# Patient Record
Sex: Male | Born: 1935
Health system: Southern US, Community
[De-identification: ages and names within clinical notes are randomized; demographics above are authoritative.]

## PROBLEM LIST (undated history)

## (undated) DIAGNOSIS — R001 Bradycardia, unspecified: Secondary | ICD-10-CM

## (undated) DIAGNOSIS — G4730 Sleep apnea, unspecified: Secondary | ICD-10-CM

## (undated) DIAGNOSIS — R55 Syncope and collapse: Secondary | ICD-10-CM

## (undated) DIAGNOSIS — R0600 Dyspnea, unspecified: Secondary | ICD-10-CM

## (undated) DIAGNOSIS — H9190 Unspecified hearing loss, unspecified ear: Secondary | ICD-10-CM

## (undated) DIAGNOSIS — I10 Essential (primary) hypertension: Secondary | ICD-10-CM

## (undated) DIAGNOSIS — E119 Type 2 diabetes mellitus without complications: Secondary | ICD-10-CM

## (undated) DIAGNOSIS — R0609 Other forms of dyspnea: Secondary | ICD-10-CM

## (undated) DIAGNOSIS — E7800 Pure hypercholesterolemia, unspecified: Secondary | ICD-10-CM

## (undated) DIAGNOSIS — R269 Unspecified abnormalities of gait and mobility: Secondary | ICD-10-CM

## (undated) HISTORY — PX: CATARACT EXTRACTION W/ INTRAOCULAR LENS  IMPLANT, BILATERAL: SHX1307

## (undated) HISTORY — DX: Syncope and collapse: R55

## (undated) HISTORY — DX: Bradycardia, unspecified: R00.1

## (undated) HISTORY — DX: Essential (primary) hypertension: I10

## (undated) HISTORY — DX: Unspecified abnormalities of gait and mobility: R26.9

---

## 2001-07-19 ENCOUNTER — Ambulatory Visit (HOSPITAL_COMMUNITY): Admission: RE | Admit: 2001-07-19 | Discharge: 2001-07-19 | Payer: Self-pay | Admitting: Specialist

## 2005-06-23 ENCOUNTER — Emergency Department (HOSPITAL_COMMUNITY): Admission: EM | Admit: 2005-06-23 | Discharge: 2005-06-23 | Payer: Self-pay

## 2007-03-10 ENCOUNTER — Ambulatory Visit: Payer: Self-pay | Admitting: Internal Medicine

## 2007-03-23 ENCOUNTER — Encounter: Payer: Self-pay | Admitting: Internal Medicine

## 2007-03-23 ENCOUNTER — Ambulatory Visit: Payer: Self-pay | Admitting: Internal Medicine

## 2009-10-13 ENCOUNTER — Encounter: Admission: RE | Admit: 2009-10-13 | Discharge: 2009-10-13 | Payer: Self-pay | Admitting: Otolaryngology

## 2011-01-29 NOTE — Op Note (Signed)
[place]  Patient:    Calvin Gray, Calvin Gray Visit Number: [id number] MRN: [id number]          Service Type: DSU Location: [facility] Iv [id number] Attending Physician:  Mick Sell Dictated by:   Chucky May, M.D. Proc. Date: 07/19/01 Admit Date:  07/19/2001 Discharge Date: 07/19/2001                             Operative Report  PREOPERATIVE DIAGNOSIS:  Cataract, right eye.  POSTOPERATIVE DIAGNOSIS:  Cataract, right eye.  OPERATION PERFORMED:  Cataract extraction with intraocular lens implant, right eye.  SURGEON:  Chucky May, M.D.  INDICATIONS FOR SURGERY:  The patient is a 77 year old male with painless progressive decrease in vision so that he has difficulty seeing for reading. On examination he was found to have a dense nuclear sclerotic and cortical cataract consistent with the decrease in visual acuity.  DESCRIPTION OF PROCEDURE:  The patient was brought to the main operating room and placed in supine position.  Anesthesia was obtained by means of topical 4% lidocaine drops with tetracaine.  The patient was then prepped and draped in the usual manner.  A lid speculum was inserted and the cornea was entered with a diamond keratome superiorly with an additional port supratemporally OcuCoat was instilled and an anterior capsulorrhexis was performed without difficulty. The nucleus was mobilized by hydrodissection, phacoemulsified, and residual cortical material removed by irrigation and aspiration.  The posterior capsule was polished and a posterior chamber lens implant was placed in the bag without difficulty.  OcuCoat was removed and replaced with balanced salt solution.  The wound was hydrated with balanced salt solution and checked for fluid leaks but none were noted.  The eye was dressed with topical Pred Forte, Ocuflox, Voltaren and a Fox shield and the patient was taken to the recovery room in excellent condition where he received  written and verbal instructions for his postoperative care and was scheduled for follow up in 24 hours. Dictated by:   Chucky May, M.D. Attending Physician:  Mick Sell DD:  08/02/01 TD:  08/02/01 Job: [id number]

## 2011-09-24 DIAGNOSIS — H43819 Vitreous degeneration, unspecified eye: Secondary | ICD-10-CM | POA: Diagnosis not present

## 2011-09-28 DIAGNOSIS — E559 Vitamin D deficiency, unspecified: Secondary | ICD-10-CM | POA: Diagnosis not present

## 2011-09-28 DIAGNOSIS — E1129 Type 2 diabetes mellitus with other diabetic kidney complication: Secondary | ICD-10-CM | POA: Diagnosis not present

## 2011-10-04 DIAGNOSIS — R972 Elevated prostate specific antigen [PSA]: Secondary | ICD-10-CM | POA: Diagnosis not present

## 2011-10-05 DIAGNOSIS — E1129 Type 2 diabetes mellitus with other diabetic kidney complication: Secondary | ICD-10-CM | POA: Diagnosis not present

## 2011-10-05 DIAGNOSIS — E785 Hyperlipidemia, unspecified: Secondary | ICD-10-CM | POA: Diagnosis not present

## 2011-10-05 DIAGNOSIS — R0602 Shortness of breath: Secondary | ICD-10-CM | POA: Diagnosis not present

## 2011-10-06 DIAGNOSIS — R0602 Shortness of breath: Secondary | ICD-10-CM | POA: Diagnosis not present

## 2011-10-11 DIAGNOSIS — R972 Elevated prostate specific antigen [PSA]: Secondary | ICD-10-CM | POA: Diagnosis not present

## 2011-10-11 DIAGNOSIS — R351 Nocturia: Secondary | ICD-10-CM | POA: Diagnosis not present

## 2011-12-31 DIAGNOSIS — I10 Essential (primary) hypertension: Secondary | ICD-10-CM | POA: Diagnosis not present

## 2011-12-31 DIAGNOSIS — E1129 Type 2 diabetes mellitus with other diabetic kidney complication: Secondary | ICD-10-CM | POA: Diagnosis not present

## 2011-12-31 DIAGNOSIS — E559 Vitamin D deficiency, unspecified: Secondary | ICD-10-CM | POA: Diagnosis not present

## 2011-12-31 DIAGNOSIS — E785 Hyperlipidemia, unspecified: Secondary | ICD-10-CM | POA: Diagnosis not present

## 2012-01-10 DIAGNOSIS — E1129 Type 2 diabetes mellitus with other diabetic kidney complication: Secondary | ICD-10-CM | POA: Diagnosis not present

## 2012-01-10 DIAGNOSIS — E785 Hyperlipidemia, unspecified: Secondary | ICD-10-CM | POA: Diagnosis not present

## 2012-01-10 DIAGNOSIS — I10 Essential (primary) hypertension: Secondary | ICD-10-CM | POA: Diagnosis not present

## 2012-01-31 ENCOUNTER — Encounter (HOSPITAL_COMMUNITY): Payer: Self-pay | Admitting: Pharmacy Technician

## 2012-01-31 DIAGNOSIS — E782 Mixed hyperlipidemia: Secondary | ICD-10-CM | POA: Diagnosis not present

## 2012-01-31 DIAGNOSIS — I10 Essential (primary) hypertension: Secondary | ICD-10-CM | POA: Diagnosis not present

## 2012-01-31 DIAGNOSIS — E119 Type 2 diabetes mellitus without complications: Secondary | ICD-10-CM | POA: Diagnosis not present

## 2012-01-31 DIAGNOSIS — R0609 Other forms of dyspnea: Secondary | ICD-10-CM | POA: Diagnosis not present

## 2012-02-03 DIAGNOSIS — E119 Type 2 diabetes mellitus without complications: Secondary | ICD-10-CM | POA: Diagnosis not present

## 2012-02-03 DIAGNOSIS — I10 Essential (primary) hypertension: Secondary | ICD-10-CM | POA: Diagnosis not present

## 2012-02-03 DIAGNOSIS — R9431 Abnormal electrocardiogram [ECG] [EKG]: Secondary | ICD-10-CM | POA: Diagnosis not present

## 2012-02-03 DIAGNOSIS — R0602 Shortness of breath: Secondary | ICD-10-CM | POA: Diagnosis not present

## 2012-02-03 DIAGNOSIS — Z01810 Encounter for preprocedural cardiovascular examination: Secondary | ICD-10-CM | POA: Diagnosis not present

## 2012-02-07 HISTORY — PX: CORONARY ANGIOPLASTY WITH STENT PLACEMENT: SHX49

## 2012-02-08 ENCOUNTER — Encounter (HOSPITAL_COMMUNITY)
Admission: RE | Disposition: A | Discharge status: Home / Self Care | Payer: Self-pay | Source: Ambulatory Visit | Attending: Cardiology

## 2012-02-08 ENCOUNTER — Encounter (HOSPITAL_COMMUNITY): Payer: Self-pay | Admitting: General Practice

## 2012-02-08 ENCOUNTER — Ambulatory Visit (HOSPITAL_COMMUNITY)
Admission: RE | Admit: 2012-02-08 | Discharge: 2012-02-09 | Disposition: A | Discharge status: Home / Self Care | Payer: Medicare Other | Source: Ambulatory Visit | Attending: Cardiology | Admitting: Cardiology

## 2012-02-08 DIAGNOSIS — E119 Type 2 diabetes mellitus without complications: Secondary | ICD-10-CM | POA: Insufficient documentation

## 2012-02-08 DIAGNOSIS — I10 Essential (primary) hypertension: Secondary | ICD-10-CM | POA: Insufficient documentation

## 2012-02-08 DIAGNOSIS — Z955 Presence of coronary angioplasty implant and graft: Secondary | ICD-10-CM

## 2012-02-08 DIAGNOSIS — Z9861 Coronary angioplasty status: Secondary | ICD-10-CM

## 2012-02-08 DIAGNOSIS — R9431 Abnormal electrocardiogram [ECG] [EKG]: Secondary | ICD-10-CM | POA: Diagnosis not present

## 2012-02-08 DIAGNOSIS — I2510 Atherosclerotic heart disease of native coronary artery without angina pectoris: Secondary | ICD-10-CM | POA: Insufficient documentation

## 2012-02-08 DIAGNOSIS — E785 Hyperlipidemia, unspecified: Secondary | ICD-10-CM | POA: Diagnosis not present

## 2012-02-08 DIAGNOSIS — R0609 Other forms of dyspnea: Secondary | ICD-10-CM | POA: Diagnosis not present

## 2012-02-08 DIAGNOSIS — R0602 Shortness of breath: Secondary | ICD-10-CM | POA: Diagnosis not present

## 2012-02-08 DIAGNOSIS — R001 Bradycardia, unspecified: Secondary | ICD-10-CM

## 2012-02-08 DIAGNOSIS — R0989 Other specified symptoms and signs involving the circulatory and respiratory systems: Secondary | ICD-10-CM | POA: Insufficient documentation

## 2012-02-08 HISTORY — DX: Type 2 diabetes mellitus without complications: E11.9

## 2012-02-08 HISTORY — DX: Other forms of dyspnea: R06.09

## 2012-02-08 HISTORY — DX: Essential (primary) hypertension: I10

## 2012-02-08 HISTORY — DX: Pure hypercholesterolemia, unspecified: E78.00

## 2012-02-08 HISTORY — PX: LEFT HEART CATHETERIZATION WITH CORONARY ANGIOGRAM: SHX5451

## 2012-02-08 HISTORY — DX: Bradycardia, unspecified: R00.1

## 2012-02-08 HISTORY — DX: Unspecified hearing loss, unspecified ear: H91.90

## 2012-02-08 HISTORY — DX: Dyspnea, unspecified: R06.00

## 2012-02-08 LAB — GLUCOSE, CAPILLARY
Glucose-Capillary: 94 mg/dL (ref 70–99)
Glucose-Capillary: 81 mg/dL (ref 70–99)
Glucose-Capillary: 98 mg/dL (ref 70–99)

## 2012-02-08 LAB — BASIC METABOLIC PANEL
BUN: 26 mg/dL — ABNORMAL HIGH (ref 6–23)
CO2: 25 mEq/L (ref 19–32)
Calcium: 9.3 mg/dL (ref 8.4–10.5)
Chloride: 105 mEq/L (ref 96–112)
Creatinine, Ser: 1.48 mg/dL — ABNORMAL HIGH (ref 0.50–1.35)
GFR calc Af Amer: 48 mL/min — ABNORMAL LOW (ref >90)
GFR calc non Af Amer: 42 mL/min — ABNORMAL LOW (ref >90)
Glucose, Bld: 105 mg/dL — ABNORMAL HIGH (ref 70–99)
Potassium: 4.2 mEq/L (ref 3.5–5.1)
Sodium: 140 mEq/L (ref 135–145)

## 2012-02-08 LAB — CBC
HCT: 35.2 % — ABNORMAL LOW (ref 39.0–52.0)
Hemoglobin: 12.6 g/dL — ABNORMAL LOW (ref 13.0–17.0)
MCH: 31.6 pg (ref 26.0–34.0)
MCHC: 35.8 g/dL (ref 30.0–36.0)
MCV: 88.2 fL (ref 78.0–100.0)
Platelets: 154 10*3/uL (ref 150–400)
RBC: 3.99 MIL/uL — ABNORMAL LOW (ref 4.22–5.81)
RDW: 12.9 % (ref 11.5–15.5)
WBC: 6.2 10*3/uL (ref 4.0–10.5)

## 2012-02-08 LAB — PROTIME-INR
INR: 1.18 (ref 0.00–1.49)
Prothrombin Time: 15.2 seconds (ref 11.6–15.2)

## 2012-02-08 LAB — HEMOGLOBIN A1C
Hgb A1c MFr Bld: 5.7 % — ABNORMAL HIGH (ref <5.7)
Mean Plasma Glucose: 117 mg/dL — ABNORMAL HIGH (ref <117)

## 2012-02-08 LAB — APTT: aPTT: 33 seconds (ref 24–37)

## 2012-02-08 SURGERY — LEFT HEART CATHETERIZATION WITH CORONARY ANGIOGRAM
Anesthesia: LOCAL

## 2012-02-08 MED ORDER — ASPIRIN EC 81 MG PO TBEC
81.0000 mg | DELAYED_RELEASE_TABLET | Freq: Every day | ORAL | Status: DC
  Administered 2012-02-09: 81 mg via ORAL
  Filled 2012-02-08 (×2): qty 1

## 2012-02-08 MED ORDER — SODIUM CHLORIDE 0.9 % IJ SOLN: 3.0000 mL | INTRAMUSCULAR | Status: DC | PRN

## 2012-02-08 MED ORDER — BIVALIRUDIN 250 MG IV SOLR
INTRAVENOUS | Status: AC
  Filled 2012-02-08: qty 250

## 2012-02-08 MED ORDER — INSULIN ASPART 100 UNIT/ML ~~LOC~~ SOLN: 0.0000 [IU] | Freq: Three times a day (TID) | SUBCUTANEOUS | Status: DC

## 2012-02-08 MED ORDER — HEPARIN (PORCINE) IN NACL 2-0.9 UNIT/ML-% IJ SOLN
INTRAMUSCULAR | Status: AC
  Filled 2012-02-08: qty 2000

## 2012-02-08 MED ORDER — ASPIRIN 81 MG PO CHEW
324.0000 mg | CHEWABLE_TABLET | ORAL | Status: AC
  Administered 2012-02-08: 324 mg via ORAL
  Filled 2012-02-08: qty 4

## 2012-02-08 MED ORDER — SODIUM CHLORIDE 0.9 % IJ SOLN: 3.0000 mL | Freq: Two times a day (BID) | INTRAMUSCULAR | Status: DC

## 2012-02-08 MED ORDER — NITROGLYCERIN 0.2 MG/ML ON CALL CATH LAB
INTRAVENOUS | Status: AC
  Filled 2012-02-08: qty 1

## 2012-02-08 MED ORDER — SODIUM CHLORIDE 0.9 % IV SOLN: 250.0000 mL | INTRAVENOUS | Status: DC | PRN

## 2012-02-08 MED ORDER — SODIUM CHLORIDE 0.9 % IV SOLN
1.0000 mL/kg/h | INTRAVENOUS | Status: AC
  Administered 2012-02-08: 1 mL/kg/h via INTRAVENOUS

## 2012-02-08 MED ORDER — LIDOCAINE HCL (PF) 1 % IJ SOLN
INTRAMUSCULAR | Status: AC
  Filled 2012-02-08: qty 30

## 2012-02-08 MED ORDER — ACETAMINOPHEN 325 MG PO TABS: 650.0000 mg | ORAL_TABLET | ORAL | Status: DC | PRN

## 2012-02-08 MED ORDER — ASPIRIN 81 MG PO CHEW: 81.0000 mg | CHEWABLE_TABLET | Freq: Every day | ORAL | Status: DC

## 2012-02-08 MED ORDER — SODIUM CHLORIDE 0.9 % IV SOLN
INTRAVENOUS | Status: DC
  Administered 2012-02-08: 08:00:00 via INTRAVENOUS

## 2012-02-08 MED ORDER — MIDAZOLAM HCL 2 MG/2ML IJ SOLN
INTRAMUSCULAR | Status: AC
  Filled 2012-02-08: qty 2

## 2012-02-08 MED ORDER — FENTANYL CITRATE 0.05 MG/ML IJ SOLN
INTRAMUSCULAR | Status: AC
  Filled 2012-02-08: qty 2

## 2012-02-08 MED ORDER — TICAGRELOR 90 MG PO TABS
90.0000 mg | ORAL_TABLET | Freq: Two times a day (BID) | ORAL | Status: DC
  Administered 2012-02-08 – 2012-02-09 (×2): 90 mg via ORAL
  Filled 2012-02-08 (×4): qty 1

## 2012-02-08 MED ORDER — SODIUM CHLORIDE 0.9 % IV SOLN: 250.0000 mL | INTRAVENOUS | Status: DC

## 2012-02-08 MED ORDER — CARVEDILOL 3.125 MG PO TABS
3.1250 mg | ORAL_TABLET | Freq: Two times a day (BID) | ORAL | Status: DC
  Administered 2012-02-08 – 2012-02-09 (×2): 3.125 mg via ORAL
  Filled 2012-02-08 (×4): qty 1

## 2012-02-08 MED ORDER — SIMVASTATIN 10 MG PO TABS
10.0000 mg | ORAL_TABLET | Freq: Every day | ORAL | Status: DC
  Administered 2012-02-08: 10 mg via ORAL
  Filled 2012-02-08 (×2): qty 1

## 2012-02-08 MED ORDER — ONDANSETRON HCL 4 MG/2ML IJ SOLN: 4.0000 mg | Freq: Four times a day (QID) | INTRAMUSCULAR | Status: DC | PRN

## 2012-02-08 MED ORDER — TICAGRELOR 90 MG PO TABS
ORAL_TABLET | ORAL | Status: AC
  Filled 2012-02-08: qty 2

## 2012-02-08 MED ORDER — CHOLECALCIFEROL 10 MCG (400 UNIT) PO TABS
400.0000 [IU] | ORAL_TABLET | Freq: Every day | ORAL | Status: DC
  Administered 2012-02-08 – 2012-02-09 (×2): 400 [IU] via ORAL
  Filled 2012-02-08 (×2): qty 1

## 2012-02-08 NOTE — Progress Notes (Signed)
TR BAND REMOVAL  LOCATION:    right radial  DEFLATED PER PROTOCOL:    yes  TIME BAND OFF / DRESSING APPLIED:    1415   SITE UPON ARRIVAL:    Level 0  SITE AFTER BAND REMOVAL:    Level 0  REVERSE ALLEN'S TEST:     positive  CIRCULATION SENSATION AND MOVEMENT:    Within Normal Limits   yes  COMMENTS:   Tolerated procedure well 

## 2012-02-08 NOTE — Interval H&P Note (Signed)
History and Physical Interval Note:  02/08/2012 8:31 AM  Calvin Gray  has presented today for surgery, with the diagnosis of abnormal ekg/shortness of breath  The various methods of treatment have been discussed with the patient and family. After consideration of risks, benefits and other options for treatment, the patient has consented to  Procedure(s) (LRB): LEFT HEART CATHETERIZATION WITH CORONARY ANGIOGRAM (N/A) as a surgical intervention .  The patients' history has been reviewed, patient examined, no change in status, stable for surgery.  I have reviewed the patients' chart and labs.  Questions were answered to the patient's satisfaction.     Pamella Pert

## 2012-02-08 NOTE — CV Procedure (Signed)
Procedure performed:  Left heart catheterization including hemodynamic monitoring of the left ventricle, selective right and left coronary arteriography. PTCA and stenting of a large mid Circumflex coronary artery with 3.5x22 mm Resolute DES.  Indication patient is a 78 year-old man with history of hypertension,  hyperlipidemia,  Diabetes Mellitus   who presents with dyspnea on exertion. Patient has  had abnormal cardiopulmonary stress test as non invasive testing which was abnormal.  Hence is brought to the cardiac catheterization lab to evaluate her coronary anatomy for definitive diagnosis of CAD.  Hemodynamic data:  Left ventricular pressure was 102/8 with LVEDP of 14 mm mercury. Aortic pressure was 99/50 with a mean of 70 mm mercury.  Left ventricle: Hemodynamics only. To conserve contrast.   Right coronary artery: The vessel is smooth, proximal portion has a 30% stenoses at the crux.   Dominant.  Left main coronary artery is large and normal.  Circumflex coronary artery: A large vessel giving origin to AV nodal and SA nodal branch. Midsegment of this circumflex coronary artery has a ulcerated 80% stenoses.  Ramus intermediate: Large vessel. Gives origin to a secondary branch which has ostial proximal 80-90% stenoses. This secondary branch is at least 2.5 mm in diameter.   LAD:  LAD gives origin to a small  diagonal-1.  LAD has mild luminal irregularities.   Interventional data: Successful PTCA and direct stenting of the mid circumflex coronary artery with 3.5 x 22 mm drug-eluting resolute stent. The stent was deployed at 10 atmospheric pressure and postdilated with a 3.5 x 15 mm [place] Quantum at 16 atmospheric pressure x2 for 40 seconds each. Stenosis reduced from ulcerated 80% to 0% with maintenance of TIMI-3 to TIMI-3 flow at the end of the procedure.  Recommendation: Patient has a large second branch of ramus intermediate which has a ostial proximal stenosis which is amenable for  angioplasty. I will reevaluate his symptoms and if his symptoms persist I been intact for staged PTCA of the same vessel. To conserve contrast and given his age and renal insufficiency I opted not to proceed with intervention of the same. He should be discharged home in the morning if he remains stable.  Technique: Under sterile precautions using a 6 French right radial  arterial access, a 6 French sheath was introduced into the right radial artery. A 5 [place] Tig 4 catheter was advanced into the ascending aorta selective  right coronary artery and left coronary artery was cannulated and angiography was performed in multiple views. The catheter was pulled back Out of the body over exchange length J-wire.  Same catheter was used to perform LV hemodynamics.  Catheter exchanged out of the body over J-Wire. NO immediate complications noted. Patient tolerated the procedure well.     Technique of intervention:  Using a 6 [place] XB 4 guide catheter theLM  coronary  was selected and cannulated. Using Angiomax for anticoagulation, I utilized a Couger guidewire and across the circumflex coronary artery without any difficulty. I placed the tip of the wire into the distal  coronary artery. Angiography was performed.   I proceeded with implantation of a 3.5 x 22 mm resolute  drug-eluting stent into the mid circumflex coronary artery. The stent was deployed at 10 atmospheric pressure for 60  seconds. The stent was then post dilated with a 3.5 x 15 mm [place] Quantum balloon. Post-balloon angioplasty results were excellent with 0% residual stenoses and TIMI-3 flow was maintained. There was no evidence of edge dissection. The guidewire was  withdrawn out of the body and the guide catheter was engaged and pulled out of the body over the J-wire the was no immediate complication. Patient tolerated the procedure well.  Disposition: Patient will be discharged in a.m. unless complications with out-patient follow up. Will need  Dual antiplatelet therapy with Brilinta and ASA 81 mg for at least 12 months.

## 2012-02-08 NOTE — H&P (Signed)
  Please see paper chart  

## 2012-02-09 DIAGNOSIS — R9431 Abnormal electrocardiogram [ECG] [EKG]: Secondary | ICD-10-CM | POA: Diagnosis not present

## 2012-02-09 DIAGNOSIS — I10 Essential (primary) hypertension: Secondary | ICD-10-CM | POA: Diagnosis not present

## 2012-02-09 DIAGNOSIS — E119 Type 2 diabetes mellitus without complications: Secondary | ICD-10-CM | POA: Diagnosis not present

## 2012-02-09 DIAGNOSIS — R0602 Shortness of breath: Secondary | ICD-10-CM | POA: Diagnosis not present

## 2012-02-09 DIAGNOSIS — E785 Hyperlipidemia, unspecified: Secondary | ICD-10-CM | POA: Diagnosis not present

## 2012-02-09 DIAGNOSIS — I2510 Atherosclerotic heart disease of native coronary artery without angina pectoris: Secondary | ICD-10-CM | POA: Diagnosis not present

## 2012-02-09 DIAGNOSIS — Z9861 Coronary angioplasty status: Secondary | ICD-10-CM

## 2012-02-09 DIAGNOSIS — R0609 Other forms of dyspnea: Secondary | ICD-10-CM | POA: Diagnosis not present

## 2012-02-09 LAB — CBC
Hemoglobin: 12.4 g/dL — ABNORMAL LOW (ref 13.0–17.0)
RBC: 3.86 MIL/uL — ABNORMAL LOW (ref 4.22–5.81)

## 2012-02-09 LAB — BASIC METABOLIC PANEL
CO2: 24 mEq/L (ref 19–32)
Glucose, Bld: 97 mg/dL (ref 70–99)
Potassium: 4.1 mEq/L (ref 3.5–5.1)
Sodium: 141 mEq/L (ref 135–145)

## 2012-02-09 LAB — GLUCOSE, CAPILLARY: Glucose-Capillary: 107 mg/dL — ABNORMAL HIGH (ref 70–99)

## 2012-02-09 MED ORDER — PRAVASTATIN SODIUM 40 MG PO TABS: 80.0000 mg | ORAL_TABLET | Freq: Every day | ORAL | Status: DC

## 2012-02-09 MED ORDER — TICAGRELOR 90 MG PO TABS: 90.0000 mg | ORAL_TABLET | Freq: Two times a day (BID) | ORAL | Status: DC

## 2012-02-09 MED FILL — Dextrose Inj 5%: INTRAVENOUS | Qty: 50 | Status: AC

## 2012-02-09 NOTE — Plan of Care (Signed)
Problem: Phase II Progression Outcomes Goal: Pain controlled Outcome: Completed/Met Date Met:  02/09/12 Painfree Goal: OOB to chair per PCI oders Outcome: Completed/Met Date Met:  02/09/12 Tolerating being up in room with no s/s intolerance Goal: Vascular site scale level 0 - I Vascular Site Scale Level 0: No bruising/bleeding/hematoma Level I (Mild): Bruising/Ecchymosis, minimal bleeding/ooozing, palpable hematoma < 3 cm Level II (Moderate): Bleeding not affecting hemodynamic parameters, pseudoaneurysm, palpable hematoma > 3 cm  Outcome: Completed/Met Date Met:  02/09/12 Level 0 Goal: No post PCI angina Outcome: Completed/Met Date Met:  02/09/12 No angina Goal: Distal pulses equal baseline assessment Outcome: Completed/Met Date Met:  02/09/12 Palpable pulses all 4 extremities Goal: Discharge plan established Outcome: Completed/Met Date Met:  02/09/12 Planned to discharge today

## 2012-02-09 NOTE — Discharge Summary (Signed)
Physician Discharge Summary  Patient ID: Calvin Gray MRN: [id number] DOB/AGE: [birth year] 78 y.o.  Admit date: 02/08/2012 Discharge date: 02/09/2012  Primary Discharge Diagnosis 1. Coronary artery disease status post PTCA and stenting of the mid large circumflex coronary artery with implantation of 3.5 x 22 mm resolute drug-eluting stent. 2. Shortness of breath and dyspnea on exertion. Anginal equivalent.  Secondary Discharge Diagnosis 3. Diabetes mellitus diet controlled. Previously was on oral hypoglycemic agents. Stopped due to 2 renal insufficiency and his blood sugars are controlled. 4. Hypertension 5. Hyperlipidemia 6. Chronic renal insufficiency with a creatinine clearance of approximately 45 mL  Significant Diagnostic Studies: Cardiac catheterization revealing high-grade stenoses in the mid circumflex coronary artery. Has a residual secondary branch of a large ramus intermediate with ostial 80-90% stenoses. Right coronary artery showing proximal 30% stenoses. LAD has mild luminal irregularities.  Consults:   Hospital Course: Patient was admitted to the hospital an elective basis for cardiac catheterization for shortness of breath and dyspnea on exertion after he had abnormal cardiopulmonary stress testing. He denied any chest pain. He underwent cardiac catheterization via right radial access route and underwent successful angioplasty. No complications were evident. He remained stable in the hospital and was felt stable for discharge the following day.   Discharge Exam: Blood pressure 134/64, pulse 60, temperature 98.2 F (36.8 C), temperature source Oral, resp. rate 19, height 5\' 10"  (1.778 m), weight 69.4 kg (153 lb), SpO2 96.00%.    General appearance: alert, cooperative, appears stated age and no distress Resp: clear to auscultation bilaterally Cardio: regular rate and rhythm, S1, S2 normal, no murmur, click, rub or gallop GI: soft, non-tender; bowel sounds normal; no masses,   no organomegaly Pulses: 2+ and symmetric Right radial access site without hematoma. Labs:   Lab Results  Component Value Date   WBC 7.4 02/09/2012   HGB 12.4* 02/09/2012   HCT 34.2* 02/09/2012   MCV 88.6 02/09/2012   PLT 157 02/09/2012    Lab 02/09/12 0424  NA 141  K 4.1  CL 109  CO2 24  BUN 26*  CREATININE 1.39*  CALCIUM 8.7  PROT --  BILITOT --  ALKPHOS --  ALT --  AST --  GLUCOSE 97      EKG: 02/09/2012 Sinus bradycardia at a rate of 57 beats a minute. Otherwise normal EKG. Normal axis normal intervals. No evidence of ischemia. No change from EKG done yesterday.  FOLLOW UP PLANS AND APPOINTMENTS  Medication List  As of 02/09/2012  8:36 AM   TAKE these medications         aspirin EC 81 MG tablet   Take 81 mg by mouth daily.      carvedilol 3.125 MG tablet   Commonly known as: COREG   Take 3.125 mg by mouth 2 (two) times daily with a meal.      cholecalciferol 400 UNITS Tabs   Commonly known as: VITAMIN D   Take 400 Units by mouth daily.      lisinopril-hydrochlorothiazide 10-12.5 MG per tablet   Commonly known as: PRINZIDE,ZESTORETIC   Take 1 tablet by mouth daily.      pravastatin 40 MG tablet   Commonly known as: PRAVACHOL   Take 2 tablets (80 mg total) by mouth daily.      Ticagrelor 90 MG Tabs tablet   Commonly known as: BRILINTA   Take 1 tablet (90 mg total) by mouth 2 (two) times daily.           Follow-up  Information    Follow up with Pamella Pert, MD in 2 weeks. (Keep appointment)    Contact information:   [address]  [place] [address] [phone]           Pamella Pert, MD 02/09/2012, 8:34 AM

## 2012-02-09 NOTE — Care Management Note (Signed)
    Page 1 of 1   02/09/2012     11:44:17 AM   CARE MANAGEMENT NOTE 02/09/2012  Patient:  Calvin Gray, Calvin Gray   Account Number:  [id number]  Date Initiated:  02/09/2012  Documentation initiated by:  Alvira Philips Assessment:   78 yr-old male adm for cardiac cath; lives with spouse, independent PTA.     DC Planning Services  CM consult      Status of service:  Completed, signed off  Discharge Disposition:  HOME/SELF CARE  Comments:  02/09/12 1030 Janeene Sand RN MSN CCM Pt to d/c on Brilinta, provided card for free 30-day supply.  Copay will be $37/month, [facility] on [address] has Brilinta in stock.

## 2012-02-09 NOTE — Discharge Instructions (Signed)
Radial Site Care Refer to this sheet in the next few weeks. These instructions provide you with information on caring for yourself after your procedure. Your caregiver may also give you more specific instructions. Your treatment has been planned according to current medical practices, but problems sometimes occur. Call your caregiver if you have any problems or questions after your procedure. HOME CARE INSTRUCTIONS  You may shower the day after the procedure.Remove the bandage (dressing) and gently wash the site with plain soap and water.Gently pat the site dry.   Do not apply powder or lotion to the site.   Do not submerge the affected site in water for 3 to 5 days.   Inspect the site at least twice daily.   Do not flex or bend the affected arm for 24 hours.   No lifting over 5 pounds (2.3 kg) for 5 days after your procedure.   Do not drive home if you are discharged the same day of the procedure. Have someone else drive you.   You may drive 24 hours after the procedure unless otherwise instructed by your caregiver.   Do not operate machinery or power tools for 24 hours.   A responsible adult should be with you for the first 24 hours after you arrive home.  What to expect:  Any bruising will usually fade within 1 to 2 weeks.   Blood that collects in the tissue (hematoma) may be painful to the touch. It should usually decrease in size and tenderness within 1 to 2 weeks.  SEEK IMMEDIATE MEDICAL CARE IF:  You have unusual pain at the radial site.   You have redness, warmth, swelling, or pain at the radial site.   You have drainage (other than a small amount of blood on the dressing).   You have chills.   You have a fever or persistent symptoms for more than 72 hours.   You have a fever and your symptoms suddenly get worse.   Your arm becomes pale, cool, tingly, or numb.   You have heavy bleeding from the site. Hold pressure on the site.  Document Released: 10/02/2010  Document Revised: 08/19/2011 Document Reviewed: 10/02/2010 ExitCare Patient Information 2012 [employer]. 

## 2012-02-09 NOTE — Plan of Care (Signed)
Problem: Phase II Progression Outcomes Goal: Tolerating diet Outcome: Completed/Met Date Met:  02/09/12 Tolerating diet

## 2012-02-09 NOTE — Progress Notes (Signed)
CARDIAC REHAB PHASE I   PRE:  Rate/Rhythm: 66SR  BP:  Supine: 130/66  Sitting:   Standing:    SaO2:   MODE:  Ambulation: 450 ft   POST:  Rate/Rhythem: 77SR  BP:  Supine:   Sitting: 142/66  Standing:    SaO2:  0850-1000 Pt walked 450 ft on RA with steady gait. Denied CP. Stated breathing felt better. Education completed with pt and wife with many questions asked by wife. Stressed importance of taking brillinta. Discussed CRP 2 and pt gave permission to refer to [facility].   Duanne Limerick

## 2012-02-11 MED FILL — Nicardipine HCl IV Soln 2.5 MG/ML: INTRAVENOUS | Qty: 1 | Status: AC

## 2012-02-17 DIAGNOSIS — Z9861 Coronary angioplasty status: Secondary | ICD-10-CM | POA: Diagnosis not present

## 2012-02-17 DIAGNOSIS — I2510 Atherosclerotic heart disease of native coronary artery without angina pectoris: Secondary | ICD-10-CM | POA: Diagnosis not present

## 2012-02-17 DIAGNOSIS — E782 Mixed hyperlipidemia: Secondary | ICD-10-CM | POA: Diagnosis not present

## 2012-02-17 DIAGNOSIS — E119 Type 2 diabetes mellitus without complications: Secondary | ICD-10-CM | POA: Diagnosis not present

## 2012-02-17 DIAGNOSIS — I10 Essential (primary) hypertension: Secondary | ICD-10-CM | POA: Diagnosis not present

## 2012-04-14 DIAGNOSIS — I2510 Atherosclerotic heart disease of native coronary artery without angina pectoris: Secondary | ICD-10-CM | POA: Diagnosis not present

## 2012-04-14 DIAGNOSIS — R0989 Other specified symptoms and signs involving the circulatory and respiratory systems: Secondary | ICD-10-CM | POA: Diagnosis not present

## 2012-04-14 DIAGNOSIS — R0609 Other forms of dyspnea: Secondary | ICD-10-CM | POA: Diagnosis not present

## 2012-04-14 DIAGNOSIS — I10 Essential (primary) hypertension: Secondary | ICD-10-CM | POA: Diagnosis not present

## 2012-04-14 DIAGNOSIS — E782 Mixed hyperlipidemia: Secondary | ICD-10-CM | POA: Diagnosis not present

## 2012-04-14 DIAGNOSIS — Z9861 Coronary angioplasty status: Secondary | ICD-10-CM | POA: Diagnosis not present

## 2012-04-17 DIAGNOSIS — R0602 Shortness of breath: Secondary | ICD-10-CM | POA: Diagnosis not present

## 2012-04-17 DIAGNOSIS — I2510 Atherosclerotic heart disease of native coronary artery without angina pectoris: Secondary | ICD-10-CM | POA: Diagnosis not present

## 2012-04-17 DIAGNOSIS — E7800 Pure hypercholesterolemia, unspecified: Secondary | ICD-10-CM | POA: Diagnosis not present

## 2012-05-05 DIAGNOSIS — R0602 Shortness of breath: Secondary | ICD-10-CM | POA: Diagnosis not present

## 2012-05-05 DIAGNOSIS — I2510 Atherosclerotic heart disease of native coronary artery without angina pectoris: Secondary | ICD-10-CM | POA: Diagnosis not present

## 2012-05-05 DIAGNOSIS — I10 Essential (primary) hypertension: Secondary | ICD-10-CM | POA: Diagnosis not present

## 2012-05-12 DIAGNOSIS — R0609 Other forms of dyspnea: Secondary | ICD-10-CM | POA: Diagnosis not present

## 2012-05-12 DIAGNOSIS — Z9861 Coronary angioplasty status: Secondary | ICD-10-CM | POA: Diagnosis not present

## 2012-05-12 DIAGNOSIS — I2510 Atherosclerotic heart disease of native coronary artery without angina pectoris: Secondary | ICD-10-CM | POA: Diagnosis not present

## 2012-05-12 DIAGNOSIS — E119 Type 2 diabetes mellitus without complications: Secondary | ICD-10-CM | POA: Diagnosis not present

## 2012-05-12 DIAGNOSIS — R0989 Other specified symptoms and signs involving the circulatory and respiratory systems: Secondary | ICD-10-CM | POA: Diagnosis not present

## 2012-06-09 ENCOUNTER — Ambulatory Visit (INDEPENDENT_AMBULATORY_CARE_PROVIDER_SITE_OTHER): Payer: Medicare Other | Admitting: Pulmonary Disease

## 2012-06-09 ENCOUNTER — Encounter: Payer: Self-pay | Admitting: Pulmonary Disease

## 2012-06-09 VITALS — BP 124/80 | HR 65 | Temp 98.1°F | Ht 70.0 in | Wt 167.0 lb

## 2012-06-09 DIAGNOSIS — G4710 Hypersomnia, unspecified: Secondary | ICD-10-CM | POA: Diagnosis not present

## 2012-06-09 DIAGNOSIS — G4731 Primary central sleep apnea: Secondary | ICD-10-CM | POA: Insufficient documentation

## 2012-06-09 NOTE — Patient Instructions (Addendum)
Will schedule for a sleep study to evaluate your abnormal breathing at night, and fatigue/sleepiness during the day.  Will arrange followup with me once the results are available.

## 2012-06-09 NOTE — Progress Notes (Signed)
  Subjective:    Patient ID: Calvin Gray, male    DOB: 04/25/1926, 78 y.o.   MRN: [id number]  HPI The patient is a very pleasant 78 year old male who I have been asked to see for possible sleep apnea.  He has been noted by his wife to have loud breathing during sleep, along with what she describes as pauses and possibly gasping arousals.  She does not believe that he has definite true snoring.  The patient states that he awakens at least 2 times a night, and very often he has difficulties getting back to sleep.  He will typically go to his family room where he will eat breakfast and then will fall asleep in the recliner.  He does not feel rested in the mornings upon arising, and notes significant fatigue during the day.  He also has some true sleepiness at times when watching television or reading.  He also notes an occasional sleepiness issue with driving distances.  The patient states that his weight is down 5 pounds over the last 2 years, and his Epworth sleepiness score today is abnormal at 11.  Sleep Questionnaire: What time do you typically go to bed?( Between what hours) 1030-11pm How long does it take you to fall asleep? 5-23mins unless reading How many times during the night do you wake up? 3 What time do you get out of bed to start your day? 0500 Do you drive or operate heavy machinery in your occupation? No How much has your weight changed (up or down) over the past two years? (In pounds) 5 lb (2.268 kg) Have you ever had a sleep study before? No Do you currently use CPAP? No Do you wear oxygen at any time? No    Review of Systems  Constitutional: Negative for fever and unexpected weight change.  HENT: Negative for ear pain, nosebleeds, congestion, sore throat, rhinorrhea, sneezing, trouble swallowing, dental problem, postnasal drip and sinus pressure.   Eyes: Negative for redness and itching.  Respiratory: Positive for shortness of breath ( with heavy exertion). Negative for cough, chest  tightness and wheezing.   Cardiovascular: Negative for palpitations and leg swelling.  Gastrointestinal: Negative for nausea and vomiting.  Genitourinary: Positive for frequency ( wakes up atleast 2-3 times to use restroom at night). Negative for dysuria.  Musculoskeletal: Negative for joint swelling.  Skin: Negative for rash.  Neurological: Negative for headaches.  Hematological: Does not bruise/bleed easily.  Psychiatric/Behavioral: Negative for dysphoric mood. The patient is not nervous/anxious.        Objective:   Physical Exam Constitutional:  Well developed, no acute distress  HENT:  Nares patent without discharge, mild septal deviation to the right  Oropharynx without exudate, palate and uvula are minimally elongated.  Eyes:  Perrla, eomi, no scleral icterus  Neck:  No JVD, no TMG  Cardiovascular:  Normal rate, regular rhythm, no rubs or gallops.  No murmurs        Intact distal pulses  Pulmonary :  Normal breath sounds, no stridor or respiratory distress   No rales, rhonchi, or wheezing  Abdominal:  Soft, nondistended, bowel sounds present.  No tenderness noted.   Musculoskeletal:  No significant lower extremity edema noted.  Lymph Nodes:  No cervical lymphadenopathy noted  Skin:  No cyanosis noted  Neurologic:  Appears sleepy, appropriate, moves all 4 extremities without obvious deficit.         Assessment & Plan:

## 2012-06-09 NOTE — Assessment & Plan Note (Signed)
The patient is having both fatigue and sleepiness during the day that may be related to sleep-disordered breathing.  His wife describes either obstructive or central apnea, and he clearly has sleep disruption at night that leads to nonrestorative sleep.  He also has an issue with returning to sleep once he awakens.  The patient is not obese, nor does he have a significantly abnormal upper airway.  However, this may be central rather than obstructive sleep apnea.  At this point, I think he would benefit from further evaluation with a sleep study.  The patient is agreeable to this approach.

## 2012-06-26 ENCOUNTER — Ambulatory Visit (HOSPITAL_BASED_OUTPATIENT_CLINIC_OR_DEPARTMENT_OTHER): Payer: Medicare Other | Attending: Pulmonary Disease

## 2012-06-26 VITALS — Ht 70.0 in | Wt 167.0 lb

## 2012-06-26 DIAGNOSIS — G4733 Obstructive sleep apnea (adult) (pediatric): Secondary | ICD-10-CM | POA: Diagnosis not present

## 2012-06-26 DIAGNOSIS — G4737 Central sleep apnea in conditions classified elsewhere: Secondary | ICD-10-CM | POA: Insufficient documentation

## 2012-06-26 DIAGNOSIS — G4710 Hypersomnia, unspecified: Secondary | ICD-10-CM

## 2012-06-27 DIAGNOSIS — E7800 Pure hypercholesterolemia, unspecified: Secondary | ICD-10-CM | POA: Diagnosis not present

## 2012-06-27 DIAGNOSIS — I2510 Atherosclerotic heart disease of native coronary artery without angina pectoris: Secondary | ICD-10-CM | POA: Diagnosis not present

## 2012-07-02 DIAGNOSIS — G4710 Hypersomnia, unspecified: Secondary | ICD-10-CM | POA: Diagnosis not present

## 2012-07-02 DIAGNOSIS — G4730 Sleep apnea, unspecified: Secondary | ICD-10-CM

## 2012-07-03 NOTE — Procedures (Signed)
NAME:  Calvin Gray, Calvin Gray                ACCOUNT NO.:  [id number]  MEDICAL RECORD NO.:  [id number]          PATIENT TYPE:  OUT  LOCATION:  SLEEP CENTER                 FACILITY[facility]  [facility]  PHYSICIAN:  Barbaraann Share, MD,FCCPDATE OF BIRTH:  05[birth year]16/1928  DATE OF STUDY:  06/26/2012                           NOCTURNAL POLYSOMNOGRAM  REFERRING PHYSICIAN:  Barbaraann Share, MD,FCCP  REFERRING PHYSICIAN:  Barbaraann Share, MD,FCCP  INDICATION FOR STUDY:  Hypersomnia with sleep apnea.  EPWORTH SLEEPINESS SCORE:  9.  SLEEP ARCHITECTURE:  The patient had a total sleep time of only 93 minutes with no slow-wave sleep or REM noted.  Sleep onset latency was prolonged at 106 minutes and sleep efficiency was very poor at 25%.  RESPIRATORY DATA:  The patient was noted to have 61 apneas with 59 being central in nature, as well as 17 obstructive hypopneas.  This gave him an apnea/hypopnea index of 50 events per hour.  The events occurred in all body positions, and there was mild snoring noted throughout.  OXYGEN DATA:  There was O2 desaturation as low as 89% with the patient's obstructive and central events.  CARDIAC DATA:  Occasional PVC noted, but no clinically significant arrhythmias were seen.  MOVEMENT/PARASOMNIA:  The patient had no significant leg jerks or other abnormal behaviors.  IMPRESSION/RECOMMENDATION: 1. Severe central and obstructive sleep apnea with an AHI of 50 events     per hour and oxygen desaturation as low as 89%.  Treatment for this     degree of sleep apnea should focus on some type of positive airway     pressure device, as well as modest weight loss.  Clinical     correlation is suggested 2. Occasional premature ventricular contraction noted, but no     clinically significant arrhythmias were seen.     Barbaraann Share, MD,FCCP Diplomate, American Board of Sleep Medicine    [facility]/MED[facility]  D:  07/02/2012 16:14:37  T:  07/03/2012 05:45:14  Job:  [id number]

## 2012-07-04 DIAGNOSIS — Z23 Encounter for immunization: Secondary | ICD-10-CM | POA: Diagnosis not present

## 2012-07-04 DIAGNOSIS — I10 Essential (primary) hypertension: Secondary | ICD-10-CM | POA: Diagnosis not present

## 2012-07-04 DIAGNOSIS — E1129 Type 2 diabetes mellitus with other diabetic kidney complication: Secondary | ICD-10-CM | POA: Diagnosis not present

## 2012-07-04 DIAGNOSIS — E559 Vitamin D deficiency, unspecified: Secondary | ICD-10-CM | POA: Diagnosis not present

## 2012-07-04 DIAGNOSIS — Z0000 Encounter for general adult medical examination without abnormal findings: Secondary | ICD-10-CM | POA: Diagnosis not present

## 2012-07-04 DIAGNOSIS — E785 Hyperlipidemia, unspecified: Secondary | ICD-10-CM | POA: Diagnosis not present

## 2012-07-07 ENCOUNTER — Ambulatory Visit (INDEPENDENT_AMBULATORY_CARE_PROVIDER_SITE_OTHER): Payer: Medicare Other | Admitting: Pulmonary Disease

## 2012-07-07 ENCOUNTER — Encounter: Payer: Self-pay | Admitting: Pulmonary Disease

## 2012-07-07 VITALS — BP 132/82 | HR 67 | Temp 97.8°F | Ht 70.0 in | Wt 168.8 lb

## 2012-07-07 DIAGNOSIS — G4731 Primary central sleep apnea: Secondary | ICD-10-CM

## 2012-07-07 DIAGNOSIS — G4730 Sleep apnea, unspecified: Secondary | ICD-10-CM

## 2012-07-07 DIAGNOSIS — G4710 Hypersomnia, unspecified: Secondary | ICD-10-CM

## 2012-07-07 NOTE — Progress Notes (Signed)
  Subjective:    Patient ID: Calvin Gray, male    DOB: 14-Mar-1926, 78 y.o.   MRN: [id number]  HPI The patient comes in today for followup of his recent sleep study.  He was found to have severe central and obstructive sleep apnea with an AHI of 50 events per hour.  Most of his events were central in nature.  I have reviewed the study with him in detail, and answered all of his questions.   Review of Systems  Constitutional: Negative for fever and unexpected weight change.  HENT: Negative for ear pain, nosebleeds, congestion, sore throat, rhinorrhea, sneezing, trouble swallowing, dental problem, postnasal drip and sinus pressure.   Eyes: Negative for redness and itching.  Respiratory: Negative for cough, chest tightness, shortness of breath and wheezing.   Cardiovascular: Negative for palpitations and leg swelling.  Gastrointestinal: Negative for nausea and vomiting.  Genitourinary: Negative for dysuria.  Musculoskeletal: Negative for joint swelling.  Skin: Negative for rash.  Neurological: Negative for headaches.  Hematological: Bruises/bleeds easily.  Psychiatric/Behavioral: Negative for dysphoric mood. The patient is not nervous/anxious.        Objective:   Physical Exam Well-developed male in no acute distress Nose without purulence or discharge noted Neck without lymphadenopathy or thyromegaly Lower extremities without edema, no cyanosis Alert and oriented, moves all 4 extremities.       Assessment & Plan:

## 2012-07-07 NOTE — Patient Instructions (Addendum)
Will start on cpap for the treatment of your central and obstructive sleep apnea.  Please call if having issues with tolerance. followup with me in 6 weeks.

## 2012-07-07 NOTE — Assessment & Plan Note (Addendum)
The patient has a complex sleep apnea with both central and obstructive events.  Because it is severe in nature, and the patient is symptomatic, I will start him initially on CPAP to see how he responds.  Because the majority of his events are central in nature, he may need adaptive servo ventilation.

## 2012-07-11 DIAGNOSIS — E785 Hyperlipidemia, unspecified: Secondary | ICD-10-CM | POA: Diagnosis not present

## 2012-07-11 DIAGNOSIS — E1129 Type 2 diabetes mellitus with other diabetic kidney complication: Secondary | ICD-10-CM | POA: Diagnosis not present

## 2012-07-11 DIAGNOSIS — I10 Essential (primary) hypertension: Secondary | ICD-10-CM | POA: Diagnosis not present

## 2012-07-11 DIAGNOSIS — I2510 Atherosclerotic heart disease of native coronary artery without angina pectoris: Secondary | ICD-10-CM | POA: Diagnosis not present

## 2012-07-14 ENCOUNTER — Telehealth: Payer: Self-pay | Admitting: Pulmonary Disease

## 2012-07-14 NOTE — Telephone Encounter (Signed)
I spoke with [facility] and they have everything they need and they will contact the pt the first of the week.  Pt spouse advised.Carron Curie, CMA

## 2012-08-18 ENCOUNTER — Ambulatory Visit: Payer: Medicare Other | Admitting: Pulmonary Disease

## 2012-08-30 ENCOUNTER — Encounter: Payer: Self-pay | Admitting: Pulmonary Disease

## 2012-08-30 ENCOUNTER — Ambulatory Visit (INDEPENDENT_AMBULATORY_CARE_PROVIDER_SITE_OTHER): Payer: Medicare Other | Admitting: Pulmonary Disease

## 2012-08-30 VITALS — BP 118/78 | HR 62 | Temp 97.6°F | Ht 70.0 in | Wt 166.0 lb

## 2012-08-30 DIAGNOSIS — G4731 Primary central sleep apnea: Secondary | ICD-10-CM

## 2012-08-30 DIAGNOSIS — G4730 Sleep apnea, unspecified: Secondary | ICD-10-CM

## 2012-08-30 NOTE — Patient Instructions (Addendum)
Your machine is currently set on auto mode.  I would like to get a download now for the last few weeks, and see how the pressure is doing and whether there is too much mask leak.  I will call you once I receive. Work with [employer] company on a better mask fit, or I can refer you to the sleep center during the day for a formal mask fitting.  Let me know. Will arrange followup once I see your download.

## 2012-08-30 NOTE — Progress Notes (Signed)
  Subjective:    Patient ID: Colon Branch, male    DOB: 03/15/1926, 78 y.o.   MRN: [id number]  HPI The patient comes in today for followup of his central sleep apnea.  He was started on CPAP at the last visit, and is currently on the automatic setting.  He has been wearing the device compliantly, but has not seen a change in his sleep or daytime alertness.  He is having some mask leak issues, and these have not resolved as of yet.   Review of Systems  Constitutional: Negative for fever and unexpected weight change.  HENT: Positive for nosebleeds ( when first starting CPAP) and sneezing. Negative for ear pain, congestion, sore throat, rhinorrhea, trouble swallowing, dental problem, postnasal drip and sinus pressure.   Eyes: Negative for redness and itching.  Respiratory: Negative for cough, chest tightness, shortness of breath and wheezing.   Cardiovascular: Negative for palpitations and leg swelling.  Gastrointestinal: Negative for nausea and vomiting.  Genitourinary: Negative for dysuria.  Musculoskeletal: Negative for joint swelling.  Skin: Negative for rash.  Neurological: Negative for headaches.  Hematological: Does not bruise/bleed easily.  Psychiatric/Behavioral: Negative for dysphoric mood. The patient is not nervous/anxious.        Objective:   Physical Exam Well-developed male in no acute distress Nose without purulence or discharge noted No skin breakdown or pressure necrosis from CPAP mask Neck without lymphadenopathy or thyromegaly Lower extremities without edema, cyanosis Alert and oriented, moves all 4 extremities.  Does not appear to be overly sleepy.       Assessment & Plan:

## 2012-08-30 NOTE — Assessment & Plan Note (Signed)
The patient has known complex sleep apnea, and was started on CPAP with the automatic setting at the last visit.  He does not feel that he is sleeping any better, and does not have improved daytime alertness.  It is unclear if this is because of mask leaks with insufficient pressure, or whether he may be having pressure induced central apneas that are worsened.  I would like to get a download off of his machine, and see where things are.  This will also allow [place] to evaluate his mask leak.  I would like to have a formal mask fitting either at the sleep Center or with his medical equipment company.

## 2012-09-26 ENCOUNTER — Telehealth: Payer: Self-pay | Admitting: Pulmonary Disease

## 2012-09-26 DIAGNOSIS — G4733 Obstructive sleep apnea (adult) (pediatric): Secondary | ICD-10-CM

## 2012-09-26 NOTE — Telephone Encounter (Signed)
Will forward to Ashtyn to call for results

## 2012-09-26 NOTE — Telephone Encounter (Signed)
Not that I know of .

## 2012-09-26 NOTE — Telephone Encounter (Signed)
D/L given to Dr Shelle Iron to review.

## 2012-09-26 NOTE — Telephone Encounter (Signed)
Please advise If you received download KC thanks

## 2012-09-27 NOTE — Telephone Encounter (Signed)
Please let pt know that his download does show significant mask leak, and he is having some mild breakthru apnea.  This may be the reason for his lack of clinical response. Would like him to get a new mask that fits ( either thru dme or at sleep center during day), and then repeat his auto study for another 2 weeks.  If he continues to have issues, may need to look at a different type of machine.  Let us know when he has a mask that fits wells and doesn't leak, and we will arrange the repeat 2 week auto

## 2012-09-28 NOTE — Telephone Encounter (Signed)
Pt and spouse aware of results and wants to have another mask fitting with [facility]. Order will be placed for this. Pt will call to let [facility] know when he has his new mask and we can then order another download for him.

## 2012-09-29 DIAGNOSIS — H43819 Vitreous degeneration, unspecified eye: Secondary | ICD-10-CM | POA: Diagnosis not present

## 2012-10-06 DIAGNOSIS — R972 Elevated prostate specific antigen [PSA]: Secondary | ICD-10-CM | POA: Diagnosis not present

## 2012-10-13 DIAGNOSIS — R972 Elevated prostate specific antigen [PSA]: Secondary | ICD-10-CM | POA: Diagnosis not present

## 2012-10-13 DIAGNOSIS — C649 Malignant neoplasm of unspecified kidney, except renal pelvis: Secondary | ICD-10-CM | POA: Diagnosis not present

## 2012-10-13 DIAGNOSIS — N320 Bladder-neck obstruction: Secondary | ICD-10-CM | POA: Diagnosis not present

## 2012-10-13 DIAGNOSIS — R351 Nocturia: Secondary | ICD-10-CM | POA: Diagnosis not present

## 2012-10-13 DIAGNOSIS — R809 Proteinuria, unspecified: Secondary | ICD-10-CM | POA: Diagnosis not present

## 2012-10-30 ENCOUNTER — Telehealth: Payer: Self-pay | Admitting: Pulmonary Disease

## 2012-10-30 DIAGNOSIS — G4733 Obstructive sleep apnea (adult) (pediatric): Secondary | ICD-10-CM

## 2012-10-30 NOTE — Telephone Encounter (Signed)
Please read my last note on this pt.  We are supposed to get a download from dme, then will call him with followup time.  See if we can get his download.

## 2012-10-30 NOTE — Telephone Encounter (Signed)
Asthyn, can you please track down the download

## 2012-10-30 NOTE — Telephone Encounter (Signed)
The pt finally has his new mask and wants to know when he needs to come in for another appt. He will send his modem back to [facility] but is aware he will need to bring his chip in so we can download his information off the machine.

## 2012-10-31 NOTE — Telephone Encounter (Signed)
Unable to reach anyone at [facility]....will send over fax requesting this information. Request faxed to [phone]

## 2012-10-31 NOTE — Telephone Encounter (Signed)
D/L has been faxed over and in in your Jerrico Covello folder for your review.

## 2012-11-01 ENCOUNTER — Encounter: Payer: Self-pay | Admitting: Pulmonary Disease

## 2012-11-01 NOTE — Telephone Encounter (Signed)
Spoke with pt and his spouse and notified of recs per [facility] He agrees to mask fit and auto setting I have sent order to [facility] for mask fit Nothing further needed per pt

## 2012-11-01 NOTE — Telephone Encounter (Signed)
LMTCB

## 2012-11-01 NOTE — Telephone Encounter (Signed)
Let him know I have reviewed his download, and it appears that his sleep apnea is adequately controlled on the auto setting.  He does have a mask leak that I would like to see lower, and perhaps this is contributing to his persistent symptoms?  I would like to have him work on mask fit, and would suggest referral to sleep center during the day to help with this if he is in agreement.  The other thing to consider is putting his machine on a fixed pressure of 13-14 and see if he will sleep better?

## 2012-11-06 ENCOUNTER — Ambulatory Visit (HOSPITAL_BASED_OUTPATIENT_CLINIC_OR_DEPARTMENT_OTHER): Payer: Medicare Other | Attending: Pulmonary Disease | Admitting: Radiology

## 2012-11-10 DIAGNOSIS — E782 Mixed hyperlipidemia: Secondary | ICD-10-CM | POA: Diagnosis not present

## 2012-12-06 ENCOUNTER — Encounter: Payer: Self-pay | Admitting: Pulmonary Disease

## 2013-01-09 DIAGNOSIS — E559 Vitamin D deficiency, unspecified: Secondary | ICD-10-CM | POA: Diagnosis not present

## 2013-01-09 DIAGNOSIS — E785 Hyperlipidemia, unspecified: Secondary | ICD-10-CM | POA: Diagnosis not present

## 2013-01-09 DIAGNOSIS — E1129 Type 2 diabetes mellitus with other diabetic kidney complication: Secondary | ICD-10-CM | POA: Diagnosis not present

## 2013-01-09 DIAGNOSIS — I10 Essential (primary) hypertension: Secondary | ICD-10-CM | POA: Diagnosis not present

## 2013-01-16 DIAGNOSIS — E785 Hyperlipidemia, unspecified: Secondary | ICD-10-CM | POA: Diagnosis not present

## 2013-01-16 DIAGNOSIS — I10 Essential (primary) hypertension: Secondary | ICD-10-CM | POA: Diagnosis not present

## 2013-01-16 DIAGNOSIS — E1129 Type 2 diabetes mellitus with other diabetic kidney complication: Secondary | ICD-10-CM | POA: Diagnosis not present

## 2013-01-16 DIAGNOSIS — I2510 Atherosclerotic heart disease of native coronary artery without angina pectoris: Secondary | ICD-10-CM | POA: Diagnosis not present

## 2013-04-25 ENCOUNTER — Telehealth: Payer: Self-pay | Admitting: Pulmonary Disease

## 2013-04-25 NOTE — Telephone Encounter (Signed)
He needs ov and is to bring his machine with him with card and power cord for download.  Make sure he has a resmed machine.

## 2013-04-25 NOTE — Telephone Encounter (Signed)
I spoke with spouse. She stated pt is still c/o feeling tired. He had a mask fit back in feb 2014 over at the sleep center-has full face mask. His machine is still on auto setting. Not sure if the mask is leaking or not. They have not been told when to f/u with [facility]. His last OV was back in December. Please advise KC thanks

## 2013-04-27 NOTE — Telephone Encounter (Signed)
Pt's spouse returned triage's call & can be reached at [phone].  Antionette Fairy

## 2013-04-27 NOTE — Telephone Encounter (Signed)
ATC patient/patient spouse No answer [email or web address]

## 2013-04-27 NOTE — Telephone Encounter (Signed)
Spoke with patients wife-- Patient has been scheduled for Tues 05/22/13 @ 11am She is aware to bring CPAP,card and cord and reports patient does have a ResMed machine Nothing further needed at this time

## 2013-05-22 ENCOUNTER — Encounter: Payer: Self-pay | Admitting: Pulmonary Disease

## 2013-05-22 ENCOUNTER — Ambulatory Visit (INDEPENDENT_AMBULATORY_CARE_PROVIDER_SITE_OTHER): Payer: Medicare Other | Admitting: Pulmonary Disease

## 2013-05-22 VITALS — BP 120/64 | HR 63 | Temp 97.8°F | Ht 70.0 in | Wt 171.6 lb

## 2013-05-22 DIAGNOSIS — G4730 Sleep apnea, unspecified: Secondary | ICD-10-CM

## 2013-05-22 DIAGNOSIS — G4731 Primary central sleep apnea: Secondary | ICD-10-CM

## 2013-05-22 NOTE — Progress Notes (Signed)
  Subjective:    Patient ID: Calvin Gray, male    DOB: 19-Jun-1926, 79 y.o.   MRN: [id number]  HPI The patient comes in today for followup of his complex sleep apnea.  He has been wearing CPAP compliantly, but has multiple issues that are interfering with his therapy.  He continues to have a lot of mask leaks, and is trying different masks and is unsure of the adjustments.  He states he currently has excessive leak on the mask he is using.  On his download today it appears that his machine has reverted from the automatic setting to a fixed pressure of 5 cm.  It is unclear how that has happened.  Finally, because he is concerned about adequate usage, he is putting the CPAP device on at 7:00 while he is watching TV in the family room.  He didn't that naps through the night, and awakens between 3 and 4 AM with the inability to return to sleep.   Review of Systems  Constitutional: Positive for fatigue. Negative for fever and unexpected weight change.  HENT: Negative for ear pain, nosebleeds, congestion, sore throat, rhinorrhea, sneezing, trouble swallowing, dental problem, postnasal drip and sinus pressure.   Eyes: Negative for redness and itching.  Respiratory: Negative for cough, chest tightness, shortness of breath and wheezing.   Cardiovascular: Negative for palpitations and leg swelling.  Gastrointestinal: Negative for nausea and vomiting.  Genitourinary: Negative for dysuria.  Musculoskeletal: Negative for joint swelling.  Skin: Negative for rash.  Neurological: Negative for headaches.  Hematological: Does not bruise/bleed easily.  Psychiatric/Behavioral: Negative for dysphoric mood. The patient is not nervous/anxious.        Objective:   Physical Exam Thin male in no acute distress Nose without purulence or discharge noted No skin breakdown or pressure necrosis from CPAP mask Neck without lymphadenopathy or thyromegaly Lower extremities without edema, cyanosis Alert and oriented, moves  all 4 extremities.       Assessment & Plan:

## 2013-05-22 NOTE — Assessment & Plan Note (Signed)
The patient continues to have issues with his mask, and also with his pressure.  I really think that we can help him if we can deliver adequate pressure to his upper airway without poor tolerance or mask leaking.  I will get him back to his medical equipment company to get his machine reset, make sure that it isn't working order, and to work with him on his mask fit.  Finally, I told the patient to not put his CPAP device on until he goes to bed and actually wants to go to sleep.

## 2013-05-22 NOTE — Patient Instructions (Addendum)
We are going to get your home care company to address your mask and pressure issues.  I would like you to call me in 2 weeks to give me an update how all of this is going.  followup with me in 6mos to check on your progress.

## 2013-06-11 ENCOUNTER — Telehealth: Payer: Self-pay | Admitting: Pulmonary Disease

## 2013-06-11 NOTE — Telephone Encounter (Signed)
Pt was last seen by [facility] on 05/22/13 with the following pt instructions:  Patient Instructions    We are going to get your home care company to address your mask and pressure issues. I would like you to call me in 2 weeks to give me an update how all of this is going.  followup with me in 6mos to check on your progress.    ------  [email or web address]

## 2013-06-12 ENCOUNTER — Other Ambulatory Visit: Payer: Self-pay | Admitting: Pulmonary Disease

## 2013-06-12 DIAGNOSIS — G4731 Primary central sleep apnea: Secondary | ICD-10-CM

## 2013-06-12 NOTE — Telephone Encounter (Signed)
[email or web address].Jennifer Castillo, CMA  

## 2013-06-12 NOTE — Telephone Encounter (Signed)
Pt's wife returned call and can be reached @ [phone]. Calvin Gray

## 2013-06-12 NOTE — Telephone Encounter (Signed)
Spoke with pt and wife-- Pt states that CPAP does not seem to be helping iwht his fatigue, decreased energy and excessive tiredness during the daytime.  Pt states that he wears it nightly starting at 10pm. States he just does not feel that it is working at all. Patient states that he is wanting to stop using.  D/L from machine from dates 9/14-9/27 in your green folder for review.

## 2013-06-12 NOTE — Telephone Encounter (Signed)
Let pt know  That his download shows his sleep apnea is fairly well controlled with the cpap.  It is doing what it is supposed to do.  I will send an order to dme to increase the upside pressure a bit on the auto setting, but I do not think his sleep apnea is the cause of his symptoms.  There are many other things that cause fatigue and lack of energy.  Do not stop using the cpap, and discuss your symptoms with your primary md.

## 2013-06-13 NOTE — Telephone Encounter (Signed)
I spoke with spouse and made her aware of [place] response. She wrote down what was told to her and will make pt aware. Nothing further needed

## 2013-06-25 DIAGNOSIS — Z23 Encounter for immunization: Secondary | ICD-10-CM | POA: Diagnosis not present

## 2013-07-19 DIAGNOSIS — Z125 Encounter for screening for malignant neoplasm of prostate: Secondary | ICD-10-CM | POA: Diagnosis not present

## 2013-07-19 DIAGNOSIS — E1129 Type 2 diabetes mellitus with other diabetic kidney complication: Secondary | ICD-10-CM | POA: Diagnosis not present

## 2013-07-19 DIAGNOSIS — E559 Vitamin D deficiency, unspecified: Secondary | ICD-10-CM | POA: Diagnosis not present

## 2013-07-19 DIAGNOSIS — I10 Essential (primary) hypertension: Secondary | ICD-10-CM | POA: Diagnosis not present

## 2013-07-19 DIAGNOSIS — Z0000 Encounter for general adult medical examination without abnormal findings: Secondary | ICD-10-CM | POA: Diagnosis not present

## 2013-07-19 DIAGNOSIS — Z1331 Encounter for screening for depression: Secondary | ICD-10-CM | POA: Diagnosis not present

## 2013-07-19 DIAGNOSIS — N390 Urinary tract infection, site not specified: Secondary | ICD-10-CM | POA: Diagnosis not present

## 2013-07-25 DIAGNOSIS — E1129 Type 2 diabetes mellitus with other diabetic kidney complication: Secondary | ICD-10-CM | POA: Diagnosis not present

## 2013-07-25 DIAGNOSIS — I2510 Atherosclerotic heart disease of native coronary artery without angina pectoris: Secondary | ICD-10-CM | POA: Diagnosis not present

## 2013-07-25 DIAGNOSIS — I10 Essential (primary) hypertension: Secondary | ICD-10-CM | POA: Diagnosis not present

## 2013-07-25 DIAGNOSIS — E785 Hyperlipidemia, unspecified: Secondary | ICD-10-CM | POA: Diagnosis not present

## 2013-10-03 DIAGNOSIS — R972 Elevated prostate specific antigen [PSA]: Secondary | ICD-10-CM | POA: Diagnosis not present

## 2013-10-03 DIAGNOSIS — N3020 Other chronic cystitis without hematuria: Secondary | ICD-10-CM | POA: Diagnosis not present

## 2013-10-08 DIAGNOSIS — H04129 Dry eye syndrome of unspecified lacrimal gland: Secondary | ICD-10-CM | POA: Diagnosis not present

## 2013-10-08 DIAGNOSIS — H02839 Dermatochalasis of unspecified eye, unspecified eyelid: Secondary | ICD-10-CM | POA: Diagnosis not present

## 2013-10-08 DIAGNOSIS — H43819 Vitreous degeneration, unspecified eye: Secondary | ICD-10-CM | POA: Diagnosis not present

## 2013-10-17 DIAGNOSIS — R351 Nocturia: Secondary | ICD-10-CM | POA: Diagnosis not present

## 2013-10-17 DIAGNOSIS — N3020 Other chronic cystitis without hematuria: Secondary | ICD-10-CM | POA: Diagnosis not present

## 2013-10-29 DIAGNOSIS — E782 Mixed hyperlipidemia: Secondary | ICD-10-CM | POA: Diagnosis not present

## 2013-10-29 DIAGNOSIS — Z79899 Other long term (current) drug therapy: Secondary | ICD-10-CM | POA: Diagnosis not present

## 2013-11-14 DIAGNOSIS — R351 Nocturia: Secondary | ICD-10-CM | POA: Diagnosis not present

## 2013-11-19 ENCOUNTER — Ambulatory Visit (INDEPENDENT_AMBULATORY_CARE_PROVIDER_SITE_OTHER): Payer: Medicare Other | Admitting: Pulmonary Disease

## 2013-11-19 ENCOUNTER — Encounter: Payer: Self-pay | Admitting: Pulmonary Disease

## 2013-11-19 VITALS — BP 122/64 | HR 65 | Temp 97.7°F | Ht 70.0 in | Wt 166.0 lb

## 2013-11-19 DIAGNOSIS — G4730 Sleep apnea, unspecified: Secondary | ICD-10-CM

## 2013-11-19 DIAGNOSIS — G4731 Primary central sleep apnea: Secondary | ICD-10-CM

## 2013-11-19 NOTE — Patient Instructions (Signed)
Will get a download off your device to see if it is working properly Will call you once I receive results, and you can decide if wearing the device is worth it to you.  Please call us if you do not hear from [place] one week after your homecare company gets the download. followup with me again in one year if staying on cpap.

## 2013-11-19 NOTE — Progress Notes (Signed)
   Subjective:    Patient ID: Calvin Gray, male    DOB: Nov 21, 1925, 80 y.o.   MRN: [id number]  HPI The patient comes in today for followup of his obstructive and central sleep apnea. He is been wearing CPAP compliantly for the most part, but did go a few weeks without wearing. He is not sure if this is helping him. He is having no issues with mask leak or pressure. His wife is unsure if he is having breakthrough apneas or snoring. He continues to have issues with his advanced sleep phase syndrome and mild insomnia, but his main complaint is that of significant fatigue during the day. However, he is denying true sleepiness with inactivity. I tried to explain to him that fatigue and sleepiness different entities.   Review of Systems  Constitutional: Negative for fever and unexpected weight change.  HENT: Negative for congestion, dental problem, ear pain, nosebleeds, postnasal drip, rhinorrhea, sinus pressure, sneezing, sore throat and trouble swallowing.   Eyes: Negative for redness and itching.  Respiratory: Negative for cough, chest tightness, shortness of breath and wheezing.   Cardiovascular: Negative for palpitations and leg swelling.  Gastrointestinal: Negative for nausea and vomiting.  Genitourinary: Negative for dysuria.  Musculoskeletal: Negative for joint swelling.  Skin: Negative for rash.  Neurological: Negative for headaches.  Hematological: Does not bruise/bleed easily.  Psychiatric/Behavioral: Negative for dysphoric mood. The patient is not nervous/anxious.        Objective:   Physical Exam Well-developed male in no acute distress Nose without purulence or discharge noted Neck without lymphadenopathy or thyromegaly Chest clear Lower extremities without edema, no cyanosis Alert and oriented, moves all 4 extremities.       Assessment & Plan:

## 2013-11-19 NOTE — Assessment & Plan Note (Addendum)
The patient continues to wear CPAP, but did take a 2 week hiatus without a significant change in how he felt. However, I think his expectations are different from mine. I expect the device to treat his snoring, apneas, and help him with restorative sleep and daytime alertness. He is more concerned about his daytime fatigue, as well as disrupted sleep associated with insomnia. I would like to get a download off his current machine to make sure that we're adequately controlling his sleep apnea. If so, then he will have to decide if it is worth it to him to continue wearing CPAP in order to help his cardiovascular health.

## 2013-11-20 ENCOUNTER — Telehealth: Payer: Self-pay | Admitting: *Deleted

## 2013-11-20 ENCOUNTER — Encounter: Payer: Self-pay | Admitting: Pulmonary Disease

## 2013-11-20 NOTE — Telephone Encounter (Signed)
Tried calling pt and LMOM. See if we can get a number where he can be reached easily during the day.

## 2013-11-20 NOTE — Telephone Encounter (Signed)
CPAP D/L received.  In green folder for review.  Per last OV note, we were to follow up with patient once receiving or patient to contact our office x 1 week after D/L. Please advise Dr Gwenette Greet. Thanks.

## 2013-11-21 ENCOUNTER — Encounter: Payer: Self-pay | Admitting: Pulmonary Disease

## 2013-11-21 NOTE — Telephone Encounter (Signed)
Will send to [facility] as FYI that number on chart is correct per wife.

## 2013-11-21 NOTE — Telephone Encounter (Signed)
Spoke with pt and wife about download, and explained the concept of pressure worsening central apnea.  The pt can either stay on cpap with auto since the majority of his sleep apnea is controlled, but he is not sleeping well subjectively.  Or, he can try ASV and will need a night at sleep center for adjustments.  The wife would like to discuss options with him, and will call us back.

## 2013-11-21 NOTE — Telephone Encounter (Signed)
Spoke with pt's wife and she states that pt works outside a lot and they missed Dr Gwenette Greet call.  She will stay by phone today to try to catch his call.  They can still be reached at [phone]

## 2013-12-06 DIAGNOSIS — I2510 Atherosclerotic heart disease of native coronary artery without angina pectoris: Secondary | ICD-10-CM | POA: Diagnosis not present

## 2013-12-06 DIAGNOSIS — E1129 Type 2 diabetes mellitus with other diabetic kidney complication: Secondary | ICD-10-CM | POA: Diagnosis not present

## 2013-12-06 DIAGNOSIS — N189 Chronic kidney disease, unspecified: Secondary | ICD-10-CM | POA: Diagnosis not present

## 2013-12-06 DIAGNOSIS — E782 Mixed hyperlipidemia: Secondary | ICD-10-CM | POA: Diagnosis not present

## 2013-12-26 DIAGNOSIS — N3020 Other chronic cystitis without hematuria: Secondary | ICD-10-CM | POA: Diagnosis not present

## 2013-12-26 DIAGNOSIS — R351 Nocturia: Secondary | ICD-10-CM | POA: Diagnosis not present

## 2014-01-15 DIAGNOSIS — E559 Vitamin D deficiency, unspecified: Secondary | ICD-10-CM | POA: Diagnosis not present

## 2014-01-15 DIAGNOSIS — E291 Testicular hypofunction: Secondary | ICD-10-CM | POA: Diagnosis not present

## 2014-01-15 DIAGNOSIS — I10 Essential (primary) hypertension: Secondary | ICD-10-CM | POA: Diagnosis not present

## 2014-01-15 DIAGNOSIS — E1129 Type 2 diabetes mellitus with other diabetic kidney complication: Secondary | ICD-10-CM | POA: Diagnosis not present

## 2014-01-22 DIAGNOSIS — N183 Chronic kidney disease, stage 3 unspecified: Secondary | ICD-10-CM | POA: Diagnosis not present

## 2014-01-22 DIAGNOSIS — E785 Hyperlipidemia, unspecified: Secondary | ICD-10-CM | POA: Diagnosis not present

## 2014-01-22 DIAGNOSIS — E1129 Type 2 diabetes mellitus with other diabetic kidney complication: Secondary | ICD-10-CM | POA: Diagnosis not present

## 2014-01-22 DIAGNOSIS — I10 Essential (primary) hypertension: Secondary | ICD-10-CM | POA: Diagnosis not present

## 2014-02-06 DIAGNOSIS — S30860A Insect bite (nonvenomous) of lower back and pelvis, initial encounter: Secondary | ICD-10-CM | POA: Diagnosis not present

## 2014-02-06 DIAGNOSIS — W57XXXA Bitten or stung by nonvenomous insect and other nonvenomous arthropods, initial encounter: Secondary | ICD-10-CM | POA: Diagnosis not present

## 2014-07-23 DIAGNOSIS — E1129 Type 2 diabetes mellitus with other diabetic kidney complication: Secondary | ICD-10-CM | POA: Diagnosis not present

## 2014-07-23 DIAGNOSIS — E559 Vitamin D deficiency, unspecified: Secondary | ICD-10-CM | POA: Diagnosis not present

## 2014-07-23 DIAGNOSIS — I10 Essential (primary) hypertension: Secondary | ICD-10-CM | POA: Diagnosis not present

## 2014-07-23 DIAGNOSIS — Z1389 Encounter for screening for other disorder: Secondary | ICD-10-CM | POA: Diagnosis not present

## 2014-07-23 DIAGNOSIS — Z23 Encounter for immunization: Secondary | ICD-10-CM | POA: Diagnosis not present

## 2014-07-23 DIAGNOSIS — Z0000 Encounter for general adult medical examination without abnormal findings: Secondary | ICD-10-CM | POA: Diagnosis not present

## 2014-07-23 DIAGNOSIS — E1329 Other specified diabetes mellitus with other diabetic kidney complication: Secondary | ICD-10-CM | POA: Diagnosis not present

## 2014-07-30 DIAGNOSIS — I10 Essential (primary) hypertension: Secondary | ICD-10-CM | POA: Diagnosis not present

## 2014-07-30 DIAGNOSIS — N183 Chronic kidney disease, stage 3 (moderate): Secondary | ICD-10-CM | POA: Diagnosis not present

## 2014-07-30 DIAGNOSIS — E785 Hyperlipidemia, unspecified: Secondary | ICD-10-CM | POA: Diagnosis not present

## 2014-07-30 DIAGNOSIS — E1121 Type 2 diabetes mellitus with diabetic nephropathy: Secondary | ICD-10-CM | POA: Diagnosis not present

## 2014-08-22 ENCOUNTER — Encounter (HOSPITAL_COMMUNITY): Payer: Self-pay | Admitting: Cardiology

## 2014-10-01 DIAGNOSIS — H02831 Dermatochalasis of right upper eyelid: Secondary | ICD-10-CM | POA: Diagnosis not present

## 2014-10-01 DIAGNOSIS — H02834 Dermatochalasis of left upper eyelid: Secondary | ICD-10-CM | POA: Diagnosis not present

## 2014-10-01 DIAGNOSIS — H35363 Drusen (degenerative) of macula, bilateral: Secondary | ICD-10-CM | POA: Diagnosis not present

## 2014-10-01 DIAGNOSIS — H04123 Dry eye syndrome of bilateral lacrimal glands: Secondary | ICD-10-CM | POA: Diagnosis not present

## 2014-10-02 ENCOUNTER — Encounter (INDEPENDENT_AMBULATORY_CARE_PROVIDER_SITE_OTHER): Payer: Medicare Other | Admitting: Ophthalmology

## 2014-10-02 DIAGNOSIS — E11329 Type 2 diabetes mellitus with mild nonproliferative diabetic retinopathy without macular edema: Secondary | ICD-10-CM

## 2014-10-02 DIAGNOSIS — H43813 Vitreous degeneration, bilateral: Secondary | ICD-10-CM | POA: Diagnosis not present

## 2014-10-02 DIAGNOSIS — H35033 Hypertensive retinopathy, bilateral: Secondary | ICD-10-CM | POA: Diagnosis not present

## 2014-10-02 DIAGNOSIS — E11319 Type 2 diabetes mellitus with unspecified diabetic retinopathy without macular edema: Secondary | ICD-10-CM | POA: Diagnosis not present

## 2014-10-02 DIAGNOSIS — I10 Essential (primary) hypertension: Secondary | ICD-10-CM | POA: Diagnosis not present

## 2014-11-20 ENCOUNTER — Ambulatory Visit (INDEPENDENT_AMBULATORY_CARE_PROVIDER_SITE_OTHER): Payer: Medicare Other | Admitting: Pulmonary Disease

## 2014-11-20 ENCOUNTER — Encounter: Payer: Self-pay | Admitting: Pulmonary Disease

## 2014-11-20 VITALS — BP 124/74 | HR 55 | Temp 97.0°F | Ht 70.0 in | Wt 163.8 lb

## 2014-11-20 DIAGNOSIS — G4737 Central sleep apnea in conditions classified elsewhere: Secondary | ICD-10-CM | POA: Diagnosis not present

## 2014-11-20 DIAGNOSIS — G4733 Obstructive sleep apnea (adult) (pediatric): Secondary | ICD-10-CM

## 2014-11-20 DIAGNOSIS — G4731 Primary central sleep apnea: Secondary | ICD-10-CM

## 2014-11-20 NOTE — Progress Notes (Signed)
   Subjective:    Patient ID: Calvin Gray, male    DOB: 11-Apr-1926, 81 y.o.   MRN: [id number]  HPI The patient comes in today for follow-up of his known complex sleep apnea. He is wearing C Pap compliantly, and his download shows only mild breakthrough events that are primarily central in nature. He was having some increased mask leak, but this normalized once he changed his cushions the end of January. The patient does not feel this EPAP device has helped his sleep or daytime energy level, but his wife states that she is not hearing breakthrough snoring or witnessing apneas.   Review of Systems  Constitutional: Negative for fever and unexpected weight change.  HENT: Negative for congestion, dental problem, ear pain, nosebleeds, postnasal drip, rhinorrhea, sinus pressure, sneezing, sore throat and trouble swallowing.   Eyes: Negative for redness and itching.  Respiratory: Negative for cough, chest tightness, shortness of breath and wheezing.   Cardiovascular: Negative for palpitations and leg swelling.  Gastrointestinal: Negative for nausea and vomiting.  Genitourinary: Negative for dysuria.  Musculoskeletal: Negative for joint swelling.  Skin: Negative for rash.  Neurological: Negative for headaches.  Hematological: Does not bruise/bleed easily.  Psychiatric/Behavioral: Negative for dysphoric mood. The patient is not nervous/anxious.        Objective:   Physical Exam Thin male in no acute distress Nose without purulence or discharge noted Neck without lymphadenopathy or thyromegaly No skin breakdown or pressure necrosis from the C Pap mask Lower extremities without edema, no cyanosis Alert and oriented, moves all 4 extremities.       Assessment & Plan:

## 2014-11-20 NOTE — Patient Instructions (Signed)
Continue on your cpap, and keep up with mask changes and supplies. Will see you back in one year, but call if you feel your sleep is getting worse.  We can then get a download and check things.

## 2014-11-20 NOTE — Assessment & Plan Note (Signed)
The patient is wearing his C Pap device compliantly by his download, but he continues to have some breakthrough events that can vary between 4 and 7 per hour. These are primarily central events, and clearly related to his complex sleep apnea syndrome. The patient has not seen a big change in his sleep or how he feels the next day, but I suspect this has nothing to do with his mildly elevated AHI on his download. If his symptoms worsen, we can always try an ASV device, but for now will leave him on CPAP and limit his pressure range in order to limit breakthrough centrals

## 2014-12-30 DIAGNOSIS — E782 Mixed hyperlipidemia: Secondary | ICD-10-CM | POA: Diagnosis not present

## 2014-12-30 DIAGNOSIS — R269 Unspecified abnormalities of gait and mobility: Secondary | ICD-10-CM | POA: Diagnosis not present

## 2014-12-30 DIAGNOSIS — E119 Type 2 diabetes mellitus without complications: Secondary | ICD-10-CM | POA: Diagnosis not present

## 2014-12-30 DIAGNOSIS — I2510 Atherosclerotic heart disease of native coronary artery without angina pectoris: Secondary | ICD-10-CM | POA: Diagnosis not present

## 2015-01-01 DIAGNOSIS — R310 Gross hematuria: Secondary | ICD-10-CM | POA: Diagnosis not present

## 2015-01-01 DIAGNOSIS — N3020 Other chronic cystitis without hematuria: Secondary | ICD-10-CM | POA: Diagnosis not present

## 2015-01-01 DIAGNOSIS — R351 Nocturia: Secondary | ICD-10-CM | POA: Diagnosis not present

## 2015-01-28 DIAGNOSIS — Z79899 Other long term (current) drug therapy: Secondary | ICD-10-CM | POA: Diagnosis not present

## 2015-01-28 DIAGNOSIS — E559 Vitamin D deficiency, unspecified: Secondary | ICD-10-CM | POA: Diagnosis not present

## 2015-01-28 DIAGNOSIS — D7589 Other specified diseases of blood and blood-forming organs: Secondary | ICD-10-CM | POA: Diagnosis not present

## 2015-01-28 DIAGNOSIS — I10 Essential (primary) hypertension: Secondary | ICD-10-CM | POA: Diagnosis not present

## 2015-01-28 DIAGNOSIS — N400 Enlarged prostate without lower urinary tract symptoms: Secondary | ICD-10-CM | POA: Diagnosis not present

## 2015-01-28 DIAGNOSIS — E1121 Type 2 diabetes mellitus with diabetic nephropathy: Secondary | ICD-10-CM | POA: Diagnosis not present

## 2015-02-04 DIAGNOSIS — E1121 Type 2 diabetes mellitus with diabetic nephropathy: Secondary | ICD-10-CM | POA: Diagnosis not present

## 2015-02-04 DIAGNOSIS — E785 Hyperlipidemia, unspecified: Secondary | ICD-10-CM | POA: Diagnosis not present

## 2015-02-04 DIAGNOSIS — I10 Essential (primary) hypertension: Secondary | ICD-10-CM | POA: Diagnosis not present

## 2015-02-04 DIAGNOSIS — N183 Chronic kidney disease, stage 3 (moderate): Secondary | ICD-10-CM | POA: Diagnosis not present

## 2015-02-04 DIAGNOSIS — Z23 Encounter for immunization: Secondary | ICD-10-CM | POA: Diagnosis not present

## 2015-08-05 DIAGNOSIS — Z0000 Encounter for general adult medical examination without abnormal findings: Secondary | ICD-10-CM | POA: Diagnosis not present

## 2015-08-05 DIAGNOSIS — Z125 Encounter for screening for malignant neoplasm of prostate: Secondary | ICD-10-CM | POA: Diagnosis not present

## 2015-08-05 DIAGNOSIS — Z13820 Encounter for screening for osteoporosis: Secondary | ICD-10-CM | POA: Diagnosis not present

## 2015-08-05 DIAGNOSIS — I10 Essential (primary) hypertension: Secondary | ICD-10-CM | POA: Diagnosis not present

## 2015-08-05 DIAGNOSIS — E559 Vitamin D deficiency, unspecified: Secondary | ICD-10-CM | POA: Diagnosis not present

## 2015-08-05 DIAGNOSIS — Z1389 Encounter for screening for other disorder: Secondary | ICD-10-CM | POA: Diagnosis not present

## 2015-08-05 DIAGNOSIS — E1121 Type 2 diabetes mellitus with diabetic nephropathy: Secondary | ICD-10-CM | POA: Diagnosis not present

## 2015-08-05 DIAGNOSIS — Z23 Encounter for immunization: Secondary | ICD-10-CM | POA: Diagnosis not present

## 2015-08-12 DIAGNOSIS — E1122 Type 2 diabetes mellitus with diabetic chronic kidney disease: Secondary | ICD-10-CM | POA: Diagnosis not present

## 2015-08-12 DIAGNOSIS — I209 Angina pectoris, unspecified: Secondary | ICD-10-CM | POA: Diagnosis not present

## 2015-08-12 DIAGNOSIS — N183 Chronic kidney disease, stage 3 (moderate): Secondary | ICD-10-CM | POA: Diagnosis not present

## 2015-08-12 DIAGNOSIS — I129 Hypertensive chronic kidney disease with stage 1 through stage 4 chronic kidney disease, or unspecified chronic kidney disease: Secondary | ICD-10-CM | POA: Diagnosis not present

## 2015-10-07 DIAGNOSIS — H16223 Keratoconjunctivitis sicca, not specified as Sjogren's, bilateral: Secondary | ICD-10-CM | POA: Diagnosis not present

## 2015-10-07 DIAGNOSIS — H43813 Vitreous degeneration, bilateral: Secondary | ICD-10-CM | POA: Diagnosis not present

## 2015-10-07 DIAGNOSIS — H52223 Regular astigmatism, bilateral: Secondary | ICD-10-CM | POA: Diagnosis not present

## 2015-10-07 DIAGNOSIS — H524 Presbyopia: Secondary | ICD-10-CM | POA: Diagnosis not present

## 2015-10-07 DIAGNOSIS — H353111 Nonexudative age-related macular degeneration, right eye, early dry stage: Secondary | ICD-10-CM | POA: Diagnosis not present

## 2015-10-07 DIAGNOSIS — H04123 Dry eye syndrome of bilateral lacrimal glands: Secondary | ICD-10-CM | POA: Diagnosis not present

## 2015-11-26 ENCOUNTER — Ambulatory Visit (INDEPENDENT_AMBULATORY_CARE_PROVIDER_SITE_OTHER): Payer: Medicare Other | Admitting: Pulmonary Disease

## 2015-11-26 ENCOUNTER — Encounter: Payer: Self-pay | Admitting: Pulmonary Disease

## 2015-11-26 VITALS — BP 112/72 | HR 58 | Ht 70.0 in | Wt 167.0 lb

## 2015-11-26 DIAGNOSIS — R0600 Dyspnea, unspecified: Secondary | ICD-10-CM | POA: Diagnosis not present

## 2015-11-26 DIAGNOSIS — G4733 Obstructive sleep apnea (adult) (pediatric): Secondary | ICD-10-CM

## 2015-11-26 DIAGNOSIS — G4737 Central sleep apnea in conditions classified elsewhere: Secondary | ICD-10-CM | POA: Diagnosis not present

## 2015-11-26 DIAGNOSIS — R5382 Chronic fatigue, unspecified: Secondary | ICD-10-CM | POA: Diagnosis not present

## 2015-11-26 DIAGNOSIS — R5383 Other fatigue: Secondary | ICD-10-CM | POA: Insufficient documentation

## 2015-11-26 DIAGNOSIS — G4731 Primary central sleep apnea: Secondary | ICD-10-CM

## 2015-11-26 NOTE — Assessment & Plan Note (Addendum)
NPSG 06/2012:  Central >>> obstructive sleep apnea with AHI 50/hr Auto 10/2012:  Great compliance, moderate mask leak, optimal pressure 13-14cm.  Auto 11/2013:  Adequate compliance, set on auto 5-18cm, ++ breakthru events 7/hr  (likely central events).  Download 11/2014:  Good compliance, auto 5-18cm, AHI 4-7/hr that are primarily central in origin.  DL 08/2015 until 11/2015 -- AHI 9, 94% compliance.    Overall feels better. Some leak issues with mask. Plan 1. Continue CPAP, 5-12 cm water. 2. Advised on cleaning mask, tubings, filters every week at least with soapy water. 3. We'll need supplies. 4. We'll need a 3 month download prior to follow-up. 5. Call the office if with issues with CPAP. 6. Follow-up in a year.

## 2015-11-26 NOTE — Patient Instructions (Signed)
1. Continue using CPAP as you usually do. 2. We will order supplies and filters. 3. Let us know if her having issues with CPAP.  Return to clinic in 1 year.

## 2015-11-26 NOTE — Progress Notes (Signed)
Subjective:    Patient ID: Calvin Gray, male    DOB: [birth year], 82 y.o.   MRN: [id number]  HPI   Patient is here for follow-up on his complex sleep apnea. Sleep study in 2013 shows AHI of 50.  ROV (11/26/15) Patient is here as follow-up on his complex sleep apnea. Last time he was seen was a year ago by Dr. Gwenette Greet. Lastr 3 month download shows AHI of 9 and he is 94% compliant. Some mask leak issues. Overall, no issues with CPAP. Feels better. Has not been admitted to last year. No new medical issues or medications since last seen in this office.  Review of Systems  Constitutional: Negative.   HENT: Negative.   Eyes: Negative.   Respiratory: Positive for shortness of breath.   Cardiovascular: Negative.   Gastrointestinal: Negative.   Endocrine: Negative.   Genitourinary: Negative.   Musculoskeletal: Negative.   Skin: Negative.   Allergic/Immunologic: Negative.   Neurological: Negative.   Hematological: Negative.   Psychiatric/Behavioral: Negative.   All other systems reviewed and are negative.  Past Medical History  Diagnosis Date  . HOH (hard of hearing)   . Hypertension   . High cholesterol   . Exertional dyspnea   . Type II diabetes mellitus ([place])     "keeps it controlled w/diet and exercise"  . Bradycardia, sinus 02/08/12     No family history on file.   Past Surgical History  Procedure Laterality Date  . Coronary angioplasty with stent placement  02/07/12    "1"  . Cataract extraction w/ intraocular lens  implant, bilateral    . Left heart catheterization with coronary angiogram N/A 02/08/2012    Procedure: LEFT HEART CATHETERIZATION WITH CORONARY ANGIOGRAM;  Surgeon: Laverda Page, MD;  Location: [facility] CATH LAB;  Service: Cardiovascular;  Laterality: N/A;    Social History   Social History  . Marital Status: Married    Spouse Name: N/A  . Number of Children: N/A  . Years of Education: N/A   Occupational History  . retired    Social History Main  Topics  . Smoking status: Never Smoker   . Smokeless tobacco: Never Used  . Alcohol Use: No  . Drug Use: No  . Sexual Activity: Not Currently   Other Topics Concern  . Not on file   Social History Narrative     No Known Allergies   Outpatient Prescriptions Prior to Visit  Medication Sig Dispense Refill  . aspirin 325 MG tablet Take 325 mg by mouth daily.    . carvedilol (COREG) 3.125 MG tablet Take 3.125 mg by mouth 2 (two) times daily with a meal.    . cholecalciferol (VITAMIN D) 400 UNITS TABS Take 400 Units by mouth daily.    Marland Kitchen lisinopril-hydrochlorothiazide (PRINZIDE,ZESTORETIC) 10-12.5 MG per tablet Take 1 tablet by mouth daily.    . pravastatin (PRAVACHOL) 80 MG tablet Take 80 mg by mouth daily.     No facility-administered medications prior to visit.   Meds ordered this encounter  Medications  . Multiple Vitamin (MULTIVITAMIN) tablet    Sig: Take 1 tablet by mouth daily.          Objective:   Physical Exam   Vitals:  Filed Vitals:   11/26/15 1055  BP: 112/72  Pulse: 58  Height: 5\' 10"  (1.778 m)  Weight: 167 lb (75.751 kg)  SpO2: 96%    Constitutional/General:  Pleasant, well-nourished, well-developed, not in any distress,  Comfortably seating.  Well kempt  Body mass index is 23.96 kg/(m^2). Wt Readings from Last 3 Encounters:  11/26/15 167 lb (75.751 kg)  11/20/14 163 lb 12.8 oz (74.299 kg)  11/19/13 166 lb (75.297 kg)      HEENT: Pupils equal and reactive to light and accommodation. Anicteric sclerae. Normal nasal mucosa.   No oral  lesions,  mouth clear,  oropharynx clear, no postnasal drip. (-) Oral thrush. No dental caries.  Airway - Mallampati class III  Neck: No masses. Midline trachea. No JVD, (-) LAD. (-) bruits appreciated.  Respiratory/Chest: Grossly normal chest. (-) deformity. (-) Accessory muscle use.  Symmetric expansion. (-) Tenderness on palpation.  Resonant on percussion.  Diminished BS on both lower lung zones. (-)  wheezing, crackles, rhonchi (-) egophony  Cardiovascular: Regular rate and  rhythm, heart sounds normal, no murmur or gallops, no peripheral edema  Gastrointestinal:  Normal bowel sounds. Soft, non-tender. No hepatosplenomegaly.  (-) masses.   Musculoskeletal:  Normal muscle tone. Normal gait.   Extremities: Grossly normal. (-) clubbing, cyanosis.  (-) edema  Skin: (-) rash,lesions seen.   Neurological/Psychiatric : alert, oriented to time, place, person. Normal mood and affect           Assessment & Plan:  Complex sleep apnea syndrome NPSG 06/2012:  Central >>> obstructive sleep apnea with AHI 50/hr Auto 10/2012:  Great compliance, moderate mask leak, optimal pressure 13-14cm.  Auto 11/2013:  Adequate compliance, set on auto 5-18cm, ++ breakthru events 7/hr  (likely central events).  Download 11/2014:  Good compliance, auto 5-18cm, AHI 4-7/hr that are primarily central in origin.  DL 08/2015 until 11/2015 -- AHI 9, 94% compliance.    Overall feels better. Some leak issues with mask. Plan 1. Continue CPAP, 5-12 cm water. 2. Advised on cleaning mask, tubings, filters every week at least with soapy water. 3. We'll need supplies. 4. We'll need a 3 month download prior to follow-up. 5. Call the office if with issues with CPAP. 6. Follow-up in a year.      Fatigue Some better with CPAP use. Will observe for now.   Return to clinic in 1 yr.  Monica Becton, MD 11/26/2015, 11:37 AM [place] [facility] Pager [phone] After 3 pm or if no answer, call [phone]

## 2015-11-26 NOTE — Assessment & Plan Note (Signed)
Some better with CPAP use. Will observe for now.

## 2015-12-08 DIAGNOSIS — H9193 Unspecified hearing loss, bilateral: Secondary | ICD-10-CM | POA: Diagnosis not present

## 2015-12-16 DIAGNOSIS — H903 Sensorineural hearing loss, bilateral: Secondary | ICD-10-CM | POA: Diagnosis not present

## 2016-01-15 DIAGNOSIS — I129 Hypertensive chronic kidney disease with stage 1 through stage 4 chronic kidney disease, or unspecified chronic kidney disease: Secondary | ICD-10-CM | POA: Diagnosis not present

## 2016-01-15 DIAGNOSIS — E559 Vitamin D deficiency, unspecified: Secondary | ICD-10-CM | POA: Diagnosis not present

## 2016-01-15 DIAGNOSIS — N400 Enlarged prostate without lower urinary tract symptoms: Secondary | ICD-10-CM | POA: Diagnosis not present

## 2016-01-15 DIAGNOSIS — E1122 Type 2 diabetes mellitus with diabetic chronic kidney disease: Secondary | ICD-10-CM | POA: Diagnosis not present

## 2016-01-22 DIAGNOSIS — I129 Hypertensive chronic kidney disease with stage 1 through stage 4 chronic kidney disease, or unspecified chronic kidney disease: Secondary | ICD-10-CM | POA: Diagnosis not present

## 2016-01-22 DIAGNOSIS — E1122 Type 2 diabetes mellitus with diabetic chronic kidney disease: Secondary | ICD-10-CM | POA: Diagnosis not present

## 2016-01-22 DIAGNOSIS — I209 Angina pectoris, unspecified: Secondary | ICD-10-CM | POA: Diagnosis not present

## 2016-01-22 DIAGNOSIS — N183 Chronic kidney disease, stage 3 (moderate): Secondary | ICD-10-CM | POA: Diagnosis not present

## 2016-03-09 DIAGNOSIS — H16223 Keratoconjunctivitis sicca, not specified as Sjogren's, bilateral: Secondary | ICD-10-CM | POA: Diagnosis not present

## 2016-03-09 DIAGNOSIS — H04123 Dry eye syndrome of bilateral lacrimal glands: Secondary | ICD-10-CM | POA: Diagnosis not present

## 2016-03-22 DIAGNOSIS — E782 Mixed hyperlipidemia: Secondary | ICD-10-CM | POA: Diagnosis not present

## 2016-03-22 DIAGNOSIS — R739 Hyperglycemia, unspecified: Secondary | ICD-10-CM | POA: Diagnosis not present

## 2016-03-22 DIAGNOSIS — I2510 Atherosclerotic heart disease of native coronary artery without angina pectoris: Secondary | ICD-10-CM | POA: Diagnosis not present

## 2016-03-23 DIAGNOSIS — H04123 Dry eye syndrome of bilateral lacrimal glands: Secondary | ICD-10-CM | POA: Diagnosis not present

## 2016-03-23 DIAGNOSIS — H16223 Keratoconjunctivitis sicca, not specified as Sjogren's, bilateral: Secondary | ICD-10-CM | POA: Diagnosis not present

## 2016-05-11 ENCOUNTER — Other Ambulatory Visit: Payer: Self-pay

## 2016-07-07 DIAGNOSIS — Z23 Encounter for immunization: Secondary | ICD-10-CM | POA: Diagnosis not present

## 2016-07-07 DIAGNOSIS — M545 Low back pain: Secondary | ICD-10-CM | POA: Diagnosis not present

## 2016-07-20 DIAGNOSIS — E559 Vitamin D deficiency, unspecified: Secondary | ICD-10-CM | POA: Diagnosis not present

## 2016-07-20 DIAGNOSIS — I10 Essential (primary) hypertension: Secondary | ICD-10-CM | POA: Diagnosis not present

## 2016-07-20 DIAGNOSIS — I129 Hypertensive chronic kidney disease with stage 1 through stage 4 chronic kidney disease, or unspecified chronic kidney disease: Secondary | ICD-10-CM | POA: Diagnosis not present

## 2016-07-20 DIAGNOSIS — E785 Hyperlipidemia, unspecified: Secondary | ICD-10-CM | POA: Diagnosis not present

## 2016-07-20 DIAGNOSIS — Z0000 Encounter for general adult medical examination without abnormal findings: Secondary | ICD-10-CM | POA: Diagnosis not present

## 2016-07-20 DIAGNOSIS — E1122 Type 2 diabetes mellitus with diabetic chronic kidney disease: Secondary | ICD-10-CM | POA: Diagnosis not present

## 2016-07-27 DIAGNOSIS — I2510 Atherosclerotic heart disease of native coronary artery without angina pectoris: Secondary | ICD-10-CM | POA: Diagnosis not present

## 2016-07-27 DIAGNOSIS — I129 Hypertensive chronic kidney disease with stage 1 through stage 4 chronic kidney disease, or unspecified chronic kidney disease: Secondary | ICD-10-CM | POA: Diagnosis not present

## 2016-07-27 DIAGNOSIS — E1122 Type 2 diabetes mellitus with diabetic chronic kidney disease: Secondary | ICD-10-CM | POA: Diagnosis not present

## 2016-07-27 DIAGNOSIS — N183 Chronic kidney disease, stage 3 (moderate): Secondary | ICD-10-CM | POA: Diagnosis not present

## 2016-07-30 DIAGNOSIS — R351 Nocturia: Secondary | ICD-10-CM | POA: Diagnosis not present

## 2016-07-30 DIAGNOSIS — R972 Elevated prostate specific antigen [PSA]: Secondary | ICD-10-CM | POA: Diagnosis not present

## 2016-07-30 DIAGNOSIS — N401 Enlarged prostate with lower urinary tract symptoms: Secondary | ICD-10-CM | POA: Diagnosis not present

## 2016-09-17 DIAGNOSIS — N401 Enlarged prostate with lower urinary tract symptoms: Secondary | ICD-10-CM | POA: Diagnosis not present

## 2016-09-17 DIAGNOSIS — R351 Nocturia: Secondary | ICD-10-CM | POA: Diagnosis not present

## 2016-09-20 ENCOUNTER — Telehealth: Payer: Self-pay | Admitting: Pulmonary Disease

## 2016-09-20 NOTE — Telephone Encounter (Signed)
    DL the last month : 93%, AHI 9 (similar with before).  May have leak isues.  Pt has a f/u schedule.  Not sure if he will need a cpap titration study > will discuss on f/u.   Monica Becton, MD 09/20/2016, 11:18 AM [place] [facility] Pager [phone] After 3 pm or if no answer, call [phone]

## 2016-09-27 DIAGNOSIS — M549 Dorsalgia, unspecified: Secondary | ICD-10-CM | POA: Diagnosis not present

## 2016-10-04 ENCOUNTER — Other Ambulatory Visit: Payer: Self-pay | Admitting: Internal Medicine

## 2016-10-04 ENCOUNTER — Encounter: Payer: Self-pay | Admitting: Pulmonary Disease

## 2016-10-04 DIAGNOSIS — M549 Dorsalgia, unspecified: Secondary | ICD-10-CM

## 2016-10-08 DIAGNOSIS — H43813 Vitreous degeneration, bilateral: Secondary | ICD-10-CM | POA: Diagnosis not present

## 2016-10-08 DIAGNOSIS — H04123 Dry eye syndrome of bilateral lacrimal glands: Secondary | ICD-10-CM | POA: Diagnosis not present

## 2016-10-08 DIAGNOSIS — H524 Presbyopia: Secondary | ICD-10-CM | POA: Diagnosis not present

## 2016-10-08 DIAGNOSIS — H353131 Nonexudative age-related macular degeneration, bilateral, early dry stage: Secondary | ICD-10-CM | POA: Diagnosis not present

## 2016-10-08 DIAGNOSIS — H52223 Regular astigmatism, bilateral: Secondary | ICD-10-CM | POA: Diagnosis not present

## 2016-10-08 DIAGNOSIS — H16223 Keratoconjunctivitis sicca, not specified as Sjogren's, bilateral: Secondary | ICD-10-CM | POA: Diagnosis not present

## 2016-10-12 ENCOUNTER — Ambulatory Visit
Admission: RE | Admit: 2016-10-12 | Discharge: 2016-10-12 | Disposition: A | Discharge status: Home / Self Care | Payer: Medicare Other | Source: Ambulatory Visit | Attending: Internal Medicine | Admitting: Internal Medicine

## 2016-10-12 DIAGNOSIS — M5126 Other intervertebral disc displacement, lumbar region: Secondary | ICD-10-CM | POA: Diagnosis not present

## 2016-10-12 DIAGNOSIS — M549 Dorsalgia, unspecified: Secondary | ICD-10-CM

## 2016-10-18 DIAGNOSIS — M4686 Other specified inflammatory spondylopathies, lumbar region: Secondary | ICD-10-CM | POA: Diagnosis not present

## 2016-10-18 DIAGNOSIS — I700 Atherosclerosis of aorta: Secondary | ICD-10-CM | POA: Diagnosis not present

## 2016-10-18 DIAGNOSIS — M5442 Lumbago with sciatica, left side: Secondary | ICD-10-CM | POA: Diagnosis not present

## 2016-11-01 DIAGNOSIS — I700 Atherosclerosis of aorta: Secondary | ICD-10-CM | POA: Diagnosis not present

## 2016-11-01 DIAGNOSIS — M5442 Lumbago with sciatica, left side: Secondary | ICD-10-CM | POA: Diagnosis not present

## 2016-11-01 DIAGNOSIS — M4686 Other specified inflammatory spondylopathies, lumbar region: Secondary | ICD-10-CM | POA: Diagnosis not present

## 2016-11-05 DIAGNOSIS — R972 Elevated prostate specific antigen [PSA]: Secondary | ICD-10-CM | POA: Diagnosis not present

## 2016-11-16 DIAGNOSIS — H04123 Dry eye syndrome of bilateral lacrimal glands: Secondary | ICD-10-CM | POA: Diagnosis not present

## 2016-11-16 DIAGNOSIS — H43813 Vitreous degeneration, bilateral: Secondary | ICD-10-CM | POA: Diagnosis not present

## 2016-11-16 DIAGNOSIS — H16223 Keratoconjunctivitis sicca, not specified as Sjogren's, bilateral: Secondary | ICD-10-CM | POA: Diagnosis not present

## 2016-11-16 DIAGNOSIS — H353131 Nonexudative age-related macular degeneration, bilateral, early dry stage: Secondary | ICD-10-CM | POA: Diagnosis not present

## 2016-11-19 DIAGNOSIS — R972 Elevated prostate specific antigen [PSA]: Secondary | ICD-10-CM | POA: Diagnosis not present

## 2016-11-19 DIAGNOSIS — N401 Enlarged prostate with lower urinary tract symptoms: Secondary | ICD-10-CM | POA: Diagnosis not present

## 2016-11-19 DIAGNOSIS — R351 Nocturia: Secondary | ICD-10-CM | POA: Diagnosis not present

## 2016-11-30 ENCOUNTER — Encounter: Payer: Self-pay | Admitting: Pulmonary Disease

## 2016-12-01 ENCOUNTER — Ambulatory Visit (INDEPENDENT_AMBULATORY_CARE_PROVIDER_SITE_OTHER): Payer: Medicare Other | Admitting: Pulmonary Disease

## 2016-12-01 ENCOUNTER — Encounter: Payer: Self-pay | Admitting: Pulmonary Disease

## 2016-12-01 DIAGNOSIS — G4731 Primary central sleep apnea: Secondary | ICD-10-CM

## 2016-12-01 DIAGNOSIS — R0609 Other forms of dyspnea: Secondary | ICD-10-CM

## 2016-12-01 DIAGNOSIS — G4700 Insomnia, unspecified: Secondary | ICD-10-CM | POA: Diagnosis not present

## 2016-12-01 NOTE — Patient Instructions (Signed)
  It was a pleasure taking care of you today!  Continue using your CPAP machine. Avoid napping during the daytime.   Please make sure you use your CPAP device everytime you sleep.  We will monitor the usage of your machine per your insurance requirement.  Your insurance company may take the machine from you if you are not using it regularly.   Please clean the mask, tubings, filter, water reservoir with soapy water every week.  Please use distilled water for the water reservoir.   Please call the office or your machine provider (DME company) if you are having issues with the device.   Return to clinic in 1 year  with NP/APP

## 2016-12-01 NOTE — Progress Notes (Addendum)
Subjective:    Patient ID: Calvin Gray, male    DOB: [birth year], 83 y.o.   MRN: [id number]  HPI Patient is documented to have complex sleep apnea syndrome. He is currently on auto CPAP 5-12 centimeters water. He is here for yearly follow-up. Download the last month in 2017 shows 93% compliant, AHI 9. P95 was 11. Download the last month :  He uses CPAP machine.  Feels  better using it. More energy. Less sleepiness. His main  issue is waking up in the middle of the night and not able to go back to sleep. He goes to bed around 11:30 PM, falls asleep within 10-15 minutes. More often than not, he wakes up at 3 in the morning and usually finds it hard to go back to sleep. He ends up getting up at 5 AM. He after breakfast, usually 1-2 hours. He also naps in the afternoon. When he naps, he does not use his CPAP.  He has a full face mask. He gets supplies frequently.   Recent SOB with exertion.  He is to have an echo soon. (-) cp.  He does not appear to be volume overloaded or has CHF.   Denies opiate intake.  (-) benzo intake.   Review of Systems  Constitutional: Negative.   HENT: Negative.   Eyes: Negative.   Respiratory: Positive for shortness of breath.   Cardiovascular: Negative.   Gastrointestinal: Negative.   Endocrine: Negative.   Genitourinary: Negative.   Musculoskeletal: Negative.   Skin: Negative.   Allergic/Immunologic: Negative.   Neurological: Negative.   Hematological: Negative.   Psychiatric/Behavioral: Negative.   All other systems reviewed and are negative.  Past Medical History:  Diagnosis Date  . Bradycardia, sinus 02/08/12  . Exertional dyspnea   . High cholesterol   . HOH (hard of hearing)   . Hypertension   . Type II diabetes mellitus ([place])    "keeps it controlled w/diet and exercise"     No family history on file.   Past Surgical History:  Procedure Laterality Date  . CATARACT EXTRACTION W/ INTRAOCULAR LENS  IMPLANT, BILATERAL    . CORONARY  ANGIOPLASTY WITH STENT PLACEMENT  02/07/12   "1"  . LEFT HEART CATHETERIZATION WITH CORONARY ANGIOGRAM N/A 02/08/2012   Procedure: LEFT HEART CATHETERIZATION WITH CORONARY ANGIOGRAM;  Surgeon: Laverda Page, MD;  Location: [facility] CATH LAB;  Service: Cardiovascular;  Laterality: N/A;    Social History   Social History  . Marital status: Married    Spouse name: N/A  . Number of children: N/A  . Years of education: N/A   Occupational History  . retired    Social History Main Topics  . Smoking status: Never Smoker  . Smokeless tobacco: Never Used  . Alcohol use No  . Drug use: No  . Sexual activity: Not Currently   Other Topics Concern  . Not on file   Social History Narrative  . No narrative on file     No Known Allergies   Outpatient Medications Prior to Visit  Medication Sig Dispense Refill  . aspirin 325 MG tablet Take 325 mg by mouth daily.    . carvedilol (COREG) 3.125 MG tablet Take 3.125 mg by mouth 2 (two) times daily with a meal.    . cholecalciferol (VITAMIN D) 400 UNITS TABS Take 400 Units by mouth daily.    Marland Kitchen lisinopril-hydrochlorothiazide (PRINZIDE,ZESTORETIC) 10-12.5 MG per tablet Take 1 tablet by mouth daily.    . Multiple Vitamin (MULTIVITAMIN)  tablet Take 1 tablet by mouth daily.    . pravastatin (PRAVACHOL) 80 MG tablet Take 80 mg by mouth daily.     No facility-administered medications prior to visit.    Meds ordered this encounter  Medications  . cycloSPORINE (RESTASIS) 0.05 % ophthalmic emulsion    Sig: 1 drop 2 (two) times daily.        Objective:   Physical Exam  Vitals:  Vitals:   12/01/16 1024  BP: 124/70  Pulse: 63  SpO2: 97%  Weight: 169 lb 9.6 oz (76.9 kg)  Height: 5\' 10"  (1.778 m)    Constitutional/General:  Pleasant, well-nourished, well-developed, not in any distress,  Comfortably seating.  Well kempt  Body mass index is 24.34 kg/m. Wt Readings from Last 3 Encounters:  12/01/16 169 lb 9.6 oz (76.9 kg)  11/26/15 167 lb  (75.8 kg)  11/20/14 163 lb 12.8 oz (74.3 kg)     HEENT: Pupils equal and reactive to light and accommodation. Anicteric sclerae. Normal nasal mucosa.   No oral  lesions,  mouth clear,  oropharynx clear, no postnasal drip. (-) Oral thrush. No dental caries.  Airway - Mallampati class III  Neck: No masses. Midline trachea. No JVD, (-) LAD. (-) bruits appreciated.  Respiratory/Chest: Grossly normal chest. (-) deformity. (-) Accessory muscle use.  Symmetric expansion. (-) Tenderness on palpation.  Resonant on percussion.  Diminished BS on both lower lung zones. (-) wheezing, crackles, rhonchi (-) egophony  Cardiovascular: Regular rate and  rhythm, heart sounds normal, no murmur or gallops, no peripheral edema  Gastrointestinal:  Normal bowel sounds. Soft, non-tender. No hepatosplenomegaly.  (-) masses.   Musculoskeletal:  Normal muscle tone. Normal gait.   Extremities: Grossly normal. (-) clubbing, cyanosis.  (-) edema  Skin: (-) rash,lesions seen.   Neurological/Psychiatric : alert, oriented to time, place, person. Normal mood and affect         Assessment & Plan:  Complex sleep apnea syndrome NPSG 06/2012:  Central apneas present.  Obstructive sleep apnea with AHI 50/hr On autocpap 5-12 cm water.   Overall feels better. More energy. Less sleepiness.  Less Some leak issues with mask.  Machine is working fine.  No event per wife.   DL the last 3 months: 99%,  AHI 7.  Over all AHI is OK but with nights with elevated AHI 2/2 leak issues.    Plan :  We extensively discussed the importance of treating OSA and the need to use PAP therapy.   Continue with autocpap 5-12 cm water.  He has some leak issues and mask needs to be changed frequently.   I think he has OSA with some central apneas.  I do not think it is primarily CSA.  He is having recent dyspnea. 2-D echo is to be ordered. I just need to make sure that his EF is not less than 45%.    Patient was instructed to  have mask, tubings, filter, reservoir cleaned at least once a week with soapy water.  Patient was instructed to call the office if he/she is having issues with the PAP device.    I advised patient to obtain sufficient amount of sleep --  7 to 8 hours at least in a 24 hr period.  Patient was advised to follow good sleep hygiene.  Patient was advised NOT to engage in activities requiring concentration and/or vigilance if he/she is and  sleepy.  Patient is NOT to drive if he/she is sleepy.   Insomnia Patient with  recent insomnia. He ends up going to bed at 11:30 PM and falls asleep within 10-15 minutes. He usually wakes up at 3 in the morning and more often than not, he finds it hard to go back to sleep. Patient ends up napping after breakfast and he also naps in the afternoon. I think his insomnia is related to poor sleep hygiene as he takes naps during the daytime. We discussed about good sleep hygiene. We discussed about avoiding naps during the day so he will be able to sleep at night. If he ends up napping during the daytime, we advised him to use his CPAP machine.   Exertional dyspnea Recent SOB.  Pt is to have an echo ordered by Dr. Einar Gip.    Return to clinic in 1 year.   Monica Becton, MD 12/01/2016, 11:32 AM [place] [facility] Pager [phone] After 3 pm or if no answer, call [phone]

## 2016-12-01 NOTE — Assessment & Plan Note (Signed)
Patient with recent insomnia. He ends up going to bed at 11:30 PM and falls asleep within 10-15 minutes. He usually wakes up at 3 in the morning and more often than not, he finds it hard to go back to sleep. Patient ends up napping after breakfast and he also naps in the afternoon. I think his insomnia is related to poor sleep hygiene as he takes naps during the daytime. We discussed about good sleep hygiene. We discussed about avoiding naps during the day so he will be able to sleep at night. If he ends up napping during the daytime, we advised him to use his CPAP machine.

## 2016-12-01 NOTE — Assessment & Plan Note (Addendum)
NPSG 06/2012:  Central apneas present.  Obstructive sleep apnea with AHI 50/hr On autocpap 5-12 cm water.   Overall feels better. More energy. Less sleepiness.  Less Some leak issues with mask.  Machine is working fine.  No event per wife.   DL the last 3 months: 99%,  AHI 7.  Over all AHI is OK but with nights with elevated AHI 2/2 leak issues.    Plan :  We extensively discussed the importance of treating OSA and the need to use PAP therapy.   Continue with autocpap 5-12 cm water.  He has some leak issues and mask needs to be changed frequently.   I think he has OSA with some central apneas.  I do not think it is primarily CSA.  He is having recent dyspnea. 2-D echo is to be ordered. I just need to make sure that his EF is not less than 45%.    Patient was instructed to have mask, tubings, filter, reservoir cleaned at least once a week with soapy water.  Patient was instructed to call the office if he/she is having issues with the PAP device.    I advised patient to obtain sufficient amount of sleep --  7 to 8 hours at least in a 24 hr period.  Patient was advised to follow good sleep hygiene.  Patient was advised NOT to engage in activities requiring concentration and/or vigilance if he/she is and  sleepy.  Patient is NOT to drive if he/she is sleepy.

## 2016-12-01 NOTE — Assessment & Plan Note (Signed)
Recent SOB.  Pt is to have an echo ordered by Dr. Einar Gip.

## 2016-12-08 DIAGNOSIS — R0602 Shortness of breath: Secondary | ICD-10-CM | POA: Diagnosis not present

## 2016-12-08 DIAGNOSIS — R5381 Other malaise: Secondary | ICD-10-CM | POA: Diagnosis not present

## 2016-12-17 DIAGNOSIS — H04123 Dry eye syndrome of bilateral lacrimal glands: Secondary | ICD-10-CM | POA: Diagnosis not present

## 2016-12-17 DIAGNOSIS — H16223 Keratoconjunctivitis sicca, not specified as Sjogren's, bilateral: Secondary | ICD-10-CM | POA: Diagnosis not present

## 2016-12-30 DIAGNOSIS — N401 Enlarged prostate with lower urinary tract symptoms: Secondary | ICD-10-CM | POA: Diagnosis not present

## 2016-12-30 DIAGNOSIS — R351 Nocturia: Secondary | ICD-10-CM | POA: Diagnosis not present

## 2017-01-17 DIAGNOSIS — I129 Hypertensive chronic kidney disease with stage 1 through stage 4 chronic kidney disease, or unspecified chronic kidney disease: Secondary | ICD-10-CM | POA: Diagnosis not present

## 2017-01-17 DIAGNOSIS — E785 Hyperlipidemia, unspecified: Secondary | ICD-10-CM | POA: Diagnosis not present

## 2017-01-17 DIAGNOSIS — R972 Elevated prostate specific antigen [PSA]: Secondary | ICD-10-CM | POA: Diagnosis not present

## 2017-01-17 DIAGNOSIS — E1121 Type 2 diabetes mellitus with diabetic nephropathy: Secondary | ICD-10-CM | POA: Diagnosis not present

## 2017-01-17 DIAGNOSIS — I10 Essential (primary) hypertension: Secondary | ICD-10-CM | POA: Diagnosis not present

## 2017-01-21 DIAGNOSIS — I2510 Atherosclerotic heart disease of native coronary artery without angina pectoris: Secondary | ICD-10-CM | POA: Diagnosis not present

## 2017-01-21 DIAGNOSIS — R0602 Shortness of breath: Secondary | ICD-10-CM | POA: Diagnosis not present

## 2017-01-21 DIAGNOSIS — R5381 Other malaise: Secondary | ICD-10-CM | POA: Diagnosis not present

## 2017-01-21 DIAGNOSIS — G4733 Obstructive sleep apnea (adult) (pediatric): Secondary | ICD-10-CM | POA: Diagnosis not present

## 2017-01-24 DIAGNOSIS — R739 Hyperglycemia, unspecified: Secondary | ICD-10-CM | POA: Diagnosis not present

## 2017-01-24 DIAGNOSIS — N183 Chronic kidney disease, stage 3 (moderate): Secondary | ICD-10-CM | POA: Diagnosis not present

## 2017-01-24 DIAGNOSIS — R5381 Other malaise: Secondary | ICD-10-CM | POA: Diagnosis not present

## 2017-01-24 DIAGNOSIS — I2510 Atherosclerotic heart disease of native coronary artery without angina pectoris: Secondary | ICD-10-CM | POA: Diagnosis not present

## 2017-01-24 DIAGNOSIS — I129 Hypertensive chronic kidney disease with stage 1 through stage 4 chronic kidney disease, or unspecified chronic kidney disease: Secondary | ICD-10-CM | POA: Diagnosis not present

## 2017-01-27 DIAGNOSIS — N401 Enlarged prostate with lower urinary tract symptoms: Secondary | ICD-10-CM | POA: Diagnosis not present

## 2017-01-27 DIAGNOSIS — R351 Nocturia: Secondary | ICD-10-CM | POA: Diagnosis not present

## 2017-03-04 DIAGNOSIS — I2510 Atherosclerotic heart disease of native coronary artery without angina pectoris: Secondary | ICD-10-CM | POA: Diagnosis not present

## 2017-03-04 DIAGNOSIS — R5383 Other fatigue: Secondary | ICD-10-CM | POA: Diagnosis not present

## 2017-03-04 DIAGNOSIS — R5381 Other malaise: Secondary | ICD-10-CM | POA: Diagnosis not present

## 2017-03-04 DIAGNOSIS — G4733 Obstructive sleep apnea (adult) (pediatric): Secondary | ICD-10-CM | POA: Diagnosis not present

## 2017-03-25 ENCOUNTER — Encounter (HOSPITAL_COMMUNITY): Payer: Self-pay

## 2017-03-25 ENCOUNTER — Encounter (HOSPITAL_COMMUNITY)
Admission: RE | Admit: 2017-03-25 | Discharge: 2017-03-25 | Disposition: A | Discharge status: Home / Self Care | Payer: Medicare Other | Source: Ambulatory Visit | Attending: Cardiology | Admitting: Cardiology

## 2017-03-25 VITALS — BP 137/61 | HR 66 | Resp 18 | Ht 69.0 in | Wt 168.0 lb

## 2017-03-25 DIAGNOSIS — E119 Type 2 diabetes mellitus without complications: Secondary | ICD-10-CM | POA: Diagnosis not present

## 2017-03-25 DIAGNOSIS — I509 Heart failure, unspecified: Secondary | ICD-10-CM | POA: Insufficient documentation

## 2017-03-25 DIAGNOSIS — Z7982 Long term (current) use of aspirin: Secondary | ICD-10-CM | POA: Diagnosis not present

## 2017-03-25 DIAGNOSIS — E785 Hyperlipidemia, unspecified: Secondary | ICD-10-CM | POA: Insufficient documentation

## 2017-03-25 DIAGNOSIS — Z79899 Other long term (current) drug therapy: Secondary | ICD-10-CM | POA: Diagnosis not present

## 2017-03-25 DIAGNOSIS — H9190 Unspecified hearing loss, unspecified ear: Secondary | ICD-10-CM | POA: Diagnosis not present

## 2017-03-25 DIAGNOSIS — I110 Hypertensive heart disease with heart failure: Secondary | ICD-10-CM | POA: Diagnosis not present

## 2017-03-25 NOTE — Progress Notes (Signed)
Calvin Gray 83 y.o. male Pulmonary Rehab Orientation Note Patient arrived today in [facility] for orientation to Pulmonary Rehab. He was transported from [employer] via wheel chair, accompanied by his wife. He has not been prescribed oxygen for home or portable use. He does wear his CPAP at night at least 4 hours. Color good, skin warm and dry. Patient is oriented to time and place. Patient's medical history, psychosocial health, and medications reviewed. Psychosocial assessment reveals pt lives with their spouse. Pt is currently retired. Pt hobbies include gardening. Pt reports  His stress level is low. Areas of stress/anxiety include Health. He is frustrated that he does not have the energy to complete tasks such as splitting wood or gardening.  Pt does not exhibit signs of depression.  PHQ2/9 score 0/na. Pt shows good  coping skills with positive outlook. He is offered emotional support and reassurance. Will continue to monitor and evaluate progress toward psychosocial goal(s) of maintaining a hopeful attitude about increasing his stamina and strength. Physical assessment reveals heart rate is normal, breath sounds clear to auscultation, no wheezes, rales, or rhonchi. Grip strength equal, strong. Distal pulses palpable. Patient reports he does take medications as prescribed. Patient states he follows a Regular diet. The patient reports no specific efforts to gain or lose weight.. Patient's weight will be monitored closely. Demonstration and practice of PLB using pulse oximeter. Patient able to return demonstration satisfactorily. Safety and hand hygiene in the exercise area reviewed with patient. Patient voices understanding of the information reviewed. Department expectations discussed with patient and achievable goals were set. The patient shows enthusiasm about attending the program and we look forward to working with this nice gentleman. The patient is scheduled for a 6 min walk test on  03/01/17 and to begin exercise on 03/08/17 in the 1030 class.   45 minutes was spent on a variety of activities such as assessment of the patient, obtaining baseline data including height, weight, BMI, and grip strength, verifying medical history, allergies, and current medications, and teaching patient strategies for performing tasks with less respiratory effort with emphasis on pursed lip breathing.

## 2017-03-29 ENCOUNTER — Encounter (HOSPITAL_COMMUNITY): Payer: Self-pay | Admitting: *Deleted

## 2017-03-31 ENCOUNTER — Encounter (HOSPITAL_COMMUNITY)
Admission: RE | Admit: 2017-03-31 | Discharge: 2017-03-31 | Disposition: A | Discharge status: Home / Self Care | Payer: Medicare Other | Source: Ambulatory Visit | Attending: Cardiology | Admitting: Cardiology

## 2017-03-31 DIAGNOSIS — I110 Hypertensive heart disease with heart failure: Secondary | ICD-10-CM | POA: Diagnosis not present

## 2017-03-31 DIAGNOSIS — Z79899 Other long term (current) drug therapy: Secondary | ICD-10-CM | POA: Diagnosis not present

## 2017-03-31 DIAGNOSIS — I509 Heart failure, unspecified: Secondary | ICD-10-CM | POA: Diagnosis not present

## 2017-03-31 DIAGNOSIS — E119 Type 2 diabetes mellitus without complications: Secondary | ICD-10-CM | POA: Diagnosis not present

## 2017-03-31 DIAGNOSIS — E785 Hyperlipidemia, unspecified: Secondary | ICD-10-CM | POA: Diagnosis not present

## 2017-03-31 DIAGNOSIS — Z7982 Long term (current) use of aspirin: Secondary | ICD-10-CM | POA: Diagnosis not present

## 2017-04-01 NOTE — Progress Notes (Signed)
Pulmonary Individual Treatment Plan  Patient Details  Name: Efstathios Sawin MRN: [id number] Date of Birth: 27-Sep-1925 Referring Provider:     Pulmonary Rehab Walk Test from 03/31/2017 in [place]  Referring Provider  Dr. Einar Gip      Initial Encounter Date:    Pulmonary Rehab Walk Test from 03/31/2017 in [place]  Date  04/01/17  Referring Provider  Dr. Einar Gip      Visit Diagnosis: Congestive heart failure, unspecified HF chronicity, unspecified heart failure type ([place])  Patient's Home Medications on Admission:   Current Outpatient Prescriptions:  .  alfuzosin (UROXATRAL) 10 MG 24 hr tablet, Take 10 mg by mouth 2 (two) times daily., Disp: , Rfl:  .  aspirin EC 81 MG tablet, Take 81 mg by mouth daily., Disp: , Rfl:  .  lisinopril-hydrochlorothiazide (PRINZIDE,ZESTORETIC) 10-12.5 MG per tablet, Take 1 tablet by mouth daily., Disp: , Rfl:  .  Multiple Vitamin (MULTIVITAMIN) tablet, Take 1 tablet by mouth daily., Disp: , Rfl:  .  pravastatin (PRAVACHOL) 80 MG tablet, Take 80 mg by mouth daily., Disp: , Rfl:  .  temazepam (RESTORIL) 15 MG capsule, Take 15 mg by mouth at bedtime as needed for sleep., Disp: , Rfl:   Past Medical History: Past Medical History:  Diagnosis Date  . Bradycardia, sinus 02/08/12  . Exertional dyspnea   . High cholesterol   . HOH (hard of hearing)   . Hypertension   . Type II diabetes mellitus ([place])    "keeps it controlled w/diet and exercise"    Tobacco Use: History  Smoking Status  . Never Smoker  Smokeless Tobacco  . Never Used    Labs: Recent Review Flowsheet Data    Labs for ITP Cardiac and Pulmonary Rehab Latest Ref Rng & Units 02/08/2012   Hemoglobin A1c <5.7 % 5.7(H)      Capillary Blood Glucose: Lab Results  Component Value Date   GLUCAP 107 (H) 02/09/2012   GLUCAP 98 02/08/2012   GLUCAP 81 02/08/2012   GLUCAP 94 02/08/2012   GLUCAP 96 02/08/2012     ADL [facility]SD:      Pulmonary Assessment Scores    Row Name 03/29/17 1142 04/01/17 1015       ADL UCSD   ADL Phase Entry Entry    SOB Score total 5  -      CAT Score   CAT Score 6  Entry  -      mMRC Score   mMRC Score  - 1       Pulmonary Function Assessment:     Pulmonary Function Assessment - 03/25/17 1422      Breath   Bilateral Breath Sounds Clear   Shortness of Breath Yes;Limiting activity      Exercise Target Goals: Date: 04/01/17  Exercise Program Goal: Individual exercise prescription set with THRR, safety & activity barriers. Participant demonstrates ability to understand and report RPE using BORG scale, to self-measure pulse accurately, and to acknowledge the importance of the exercise prescription.  Exercise Prescription Goal: Starting with aerobic activity 30 plus minutes a day, 3 days per week for initial exercise prescription. Provide home exercise prescription and guidelines that participant acknowledges understanding prior to discharge.  Activity Barriers & Risk Stratification:   6 Minute Walk:     6 Minute Walk    Row Name 04/01/17 1015         6 Minute Walk   Phase Initial  Distance 900 feet     Walk Time 6 minutes     # of Rest Breaks 0     MPH 1.7     METS 2.3     RPE 11     Perceived Dyspnea  1     Symptoms No     Resting HR 75 bpm     Resting BP 113/69     Max Ex. HR 95 bpm     Max Ex. BP 145/71       Interval HR   Baseline HR 75     1 Minute HR 92     2 Minute HR 91     3 Minute HR 91     4 Minute HR 91     5 Minute HR 95     6 Minute HR 93     2 Minute Post HR 84     Interval Heart Rate? Yes       Interval Oxygen   Interval Oxygen? Yes     Baseline Oxygen Saturation % 96 %     Baseline Liters of Oxygen 0 L     1 Minute Oxygen Saturation % 96 %     1 Minute Liters of Oxygen 0 L     2 Minute Oxygen Saturation % 96 %     2 Minute Liters of Oxygen 0 L     3 Minute Oxygen Saturation % 96 %     3 Minute Liters of Oxygen 0 L     4  Minute Oxygen Saturation % 96 %     4 Minute Liters of Oxygen 0 L     5 Minute Oxygen Saturation % 96 %     5 Minute Liters of Oxygen 0 L     6 Minute Oxygen Saturation % 96 %     6 Minute Liters of Oxygen 0 L     2 Minute Post Oxygen Saturation % 96 %     2 Minute Post Liters of Oxygen 0 L        Oxygen Initial Assessment:     Oxygen Initial Assessment - 04/01/17 1014      Initial 6 min Walk   Oxygen Used None   Resting Oxygen Saturation  during 6 min walk 96 %   Exercise Oxygen Saturation  during 6 min walk 96 %     Program Oxygen Prescription   Program Oxygen Prescription None      Oxygen Re-Evaluation:   Oxygen Discharge (Final Oxygen Re-Evaluation):   Initial Exercise Prescription:     Initial Exercise Prescription - 04/01/17 1000      Date of Initial Exercise RX and Referring Provider   Date 04/01/17   Referring Provider Dr. Einar Gip     Bike   Level 0.4   Minutes 17     NuStep   Level 2   Minutes 17   METs 1.5     Track   Laps 6   Minutes 17     Prescription Details   Frequency (times per week) 2   Duration Progress to 45 minutes of aerobic exercise without signs/symptoms of physical distress     Intensity   THRR 40-80% of Max Heartrate 52-105   Ratings of Perceived Exertion 11-13   Perceived Dyspnea 0-4     Progression   Progression Continue progressive overload as per policy without signs/symptoms or physical distress.     Horticulturist, commercial Prescription  Yes   Weight orange bands   Reps 10-15      Perform Capillary Blood Glucose checks as needed.  Exercise Prescription Changes:   Exercise Comments:   Exercise Goals and Review:     Exercise Goals    Row Name 03/25/17 1400             Exercise Goals   Increase Physical Activity Yes       Intervention Provide advice, education, support and counseling about physical activity/exercise needs.;Develop an individualized exercise prescription for aerobic and  resistive training based on initial evaluation findings, risk stratification, comorbidities and participant's personal goals.       Expected Outcomes Achievement of increased cardiorespiratory fitness and enhanced flexibility, muscular endurance and strength shown through measurements of functional capacity and personal statement of participant.       Increase Strength and Stamina Yes       Intervention Provide advice, education, support and counseling about physical activity/exercise needs.;Develop an individualized exercise prescription for aerobic and resistive training based on initial evaluation findings, risk stratification, comorbidities and participant's personal goals.       Expected Outcomes Achievement of increased cardiorespiratory fitness and enhanced flexibility, muscular endurance and strength shown through measurements of functional capacity and personal statement of participant.          Exercise Goals Re-Evaluation :   Discharge Exercise Prescription (Final Exercise Prescription Changes):   Nutrition:  Target Goals: Understanding of nutrition guidelines, daily intake of sodium 1500mg , cholesterol 200mg , calories 30% from fat and 7% or less from saturated fats, daily to have 5 or more servings of fruits and vegetables.  Biometrics:    Nutrition Therapy Plan and Nutrition Goals:   Nutrition Discharge: Rate Your Plate Scores:   Nutrition Goals Re-Evaluation:   Nutrition Goals Discharge (Final Nutrition Goals Re-Evaluation):   Psychosocial: Target Goals: Acknowledge presence or absence of significant depression and/or stress, maximize coping skills, provide positive support system. Participant is able to verbalize types and ability to use techniques and skills needed for reducing stress and depression.  Initial Review & Psychosocial Screening:     Initial Psych Review & Screening - 03/25/17 1422      Initial Review   Current issues with None Identified      Family Dynamics   Good Support System? Yes     Barriers   Psychosocial barriers to participate in program There are no identifiable barriers or psychosocial needs.     Screening Interventions   Interventions Encouraged to exercise      Quality of Life Scores:   PHQ-9: Recent Review Flowsheet Data    Depression screen [facility] 2/9 03/25/2017   Decreased Interest 0   Down, Depressed, Hopeless 0   PHQ - 2 Score 0     Interpretation of Total Score  Total Score Depression Severity:  1-4 = Minimal depression, 5-9 = Mild depression, 10-14 = Moderate depression, 15-19 = Moderately severe depression, 20-27 = Severe depression   Psychosocial Evaluation and Intervention:     Psychosocial Evaluation - 03/25/17 1423      Psychosocial Evaluation & Interventions   Interventions Encouraged to exercise with the program and follow exercise prescription   Continue Psychosocial Services  No Follow up required      Psychosocial Re-Evaluation:   Psychosocial Discharge (Final Psychosocial Re-Evaluation):   Education: Education Goals: Education classes will be provided on a weekly basis, covering required topics. Participant will state understanding/return demonstration of topics presented.  Learning Barriers/Preferences:  Learning Barriers/Preferences - 03/25/17 1417      Learning Barriers/Preferences   Learning Barriers None   Learning Preferences Written Material;Group Instruction;Individual Instruction;Skilled Demonstration;Verbal Instruction      Education Topics: Risk Factor Reduction:  -Group instruction that is supported by a PowerPoint presentation. Instructor discusses the definition of a risk factor, different risk factors for pulmonary disease, and how the heart and lungs work together.     Nutrition for Pulmonary Patient:  -Group instruction provided by PowerPoint slides, verbal discussion, and written materials to support subject matter. The instructor gives an  explanation and review of healthy diet recommendations, which includes a discussion on weight management, recommendations for fruit and vegetable consumption, as well as protein, fluid, caffeine, fiber, sodium, sugar, and alcohol. Tips for eating when patients are short of breath are discussed.   Pursed Lip Breathing:  -Group instruction that is supported by demonstration and informational handouts. Instructor discusses the benefits of pursed lip and diaphragmatic breathing and detailed demonstration on how to preform both.     Oxygen Safety:  -Group instruction provided by PowerPoint, verbal discussion, and written material to support subject matter. There is an overview of "What is Oxygen" and "Why do we need it".  Instructor also reviews how to create a safe environment for oxygen use, the importance of using oxygen as prescribed, and the risks of noncompliance. There is a brief discussion on traveling with oxygen and resources the patient may utilize.   Oxygen Equipment:  -Group instruction provided by [facility] Staff utilizing handouts, written materials, and equipment demonstrations.   Signs and Symptoms:  -Group instruction provided by written material and verbal discussion to support subject matter. Warning signs and symptoms of infection, stroke, and heart attack are reviewed and when to call the physician/911 reinforced. Tips for preventing the spread of infection discussed.   Advanced Directives:  -Group instruction provided by verbal instruction and written material to support subject matter. Instructor reviews Advanced Directive laws and proper instruction for filling out document.   Pulmonary Video:  -Group video education that reviews the importance of medication and oxygen compliance, exercise, good nutrition, pulmonary hygiene, and pursed lip and diaphragmatic breathing for the pulmonary patient.   Exercise for the Pulmonary Patient:  -Group instruction that is  supported by a PowerPoint presentation. Instructor discusses benefits of exercise, core components of exercise, frequency, duration, and intensity of an exercise routine, importance of utilizing pulse oximetry during exercise, safety while exercising, and options of places to exercise outside of rehab.     Pulmonary Medications:  -Verbally interactive group education provided by instructor with focus on inhaled medications and proper administration.   Anatomy and Physiology of the Respiratory System and Intimacy:  -Group instruction provided by PowerPoint, verbal discussion, and written material to support subject matter. Instructor reviews respiratory cycle and anatomical components of the respiratory system and their functions. Instructor also reviews differences in obstructive and restrictive respiratory diseases with examples of each. Intimacy, Sex, and Sexuality differences are reviewed with a discussion on how relationships can change when diagnosed with pulmonary disease. Common sexual concerns are reviewed.   MD DAY -A group question and answer session with a medical doctor that allows participants to ask questions that relate to their pulmonary disease state.   OTHER EDUCATION -Group or individual verbal, written, or video instructions that support the educational goals of the pulmonary rehab program.   Knowledge Questionnaire Score:   Core Components/Risk Factors/Patient Goals at Admission:     Personal Goals and  Risk Factors at Admission - 03/25/17 1423      Core Components/Risk Factors/Patient Goals on Admission   Improve shortness of breath with ADL's Yes   Intervention Provide education, individualized exercise plan and daily activity instruction to help decrease symptoms of SOB with activities of daily living.   Expected Outcomes Short Term: Achieves a reduction of symptoms when performing activities of daily living.   Heart Failure Yes   Intervention Provide a combined  exercise and nutrition program that is supplemented with education, support and counseling about heart failure. Directed toward relieving symptoms such as shortness of breath, decreased exercise tolerance, and extremity edema.   Expected Outcomes Improve functional capacity of life;Short term: Attendance in program 2-3 days a week with increased exercise capacity. Reported lower sodium intake. Reported increased fruit and vegetable intake. Reports medication compliance.;Short term: Daily weights obtained and reported for increase. Utilizing diuretic protocols set by physician.;Long term: Adoption of self-care skills and reduction of barriers for early signs and symptoms recognition and intervention leading to self-care maintenance.      Core Components/Risk Factors/Patient Goals Review:    Core Components/Risk Factors/Patient Goals at Discharge (Final Review):    ITP Comments:   Comments:

## 2017-04-07 ENCOUNTER — Encounter (HOSPITAL_COMMUNITY)
Admission: RE | Admit: 2017-04-07 | Discharge: 2017-04-07 | Disposition: A | Discharge status: Home / Self Care | Payer: Medicare Other | Source: Ambulatory Visit | Attending: Cardiology | Admitting: Cardiology

## 2017-04-07 VITALS — Wt 166.2 lb

## 2017-04-07 DIAGNOSIS — E119 Type 2 diabetes mellitus without complications: Secondary | ICD-10-CM | POA: Diagnosis not present

## 2017-04-07 DIAGNOSIS — E785 Hyperlipidemia, unspecified: Secondary | ICD-10-CM | POA: Diagnosis not present

## 2017-04-07 DIAGNOSIS — I110 Hypertensive heart disease with heart failure: Secondary | ICD-10-CM | POA: Diagnosis not present

## 2017-04-07 DIAGNOSIS — I509 Heart failure, unspecified: Secondary | ICD-10-CM | POA: Diagnosis not present

## 2017-04-07 DIAGNOSIS — Z7982 Long term (current) use of aspirin: Secondary | ICD-10-CM | POA: Diagnosis not present

## 2017-04-07 DIAGNOSIS — Z79899 Other long term (current) drug therapy: Secondary | ICD-10-CM | POA: Diagnosis not present

## 2017-04-07 NOTE — Progress Notes (Signed)
Daily Session Note  Patient Details  Name: Calvin Gray MRN: [id number] Date of Birth: [birth year] Referring Provider:     Pulmonary Rehab Walk Test from 03/31/2017 in [place]  Referring Provider  Dr. Einar Gip      Encounter Date: 04/07/2017  Check In:     Session Check In - 04/07/17 1021      Check-In   Location [facility]   Staff Present Su Hilt, MS, ACSM RCEP, Exercise Physiologist;Joan Leonia Reeves, RN, Roque Cash, RN   Supervising physician immediately available to respond to emergencies [facility] immediately available   Physician(s) Dr. Maryland Pink   Medication changes reported     No   Fall or balance concerns reported    No   Tobacco Cessation No Change   Warm-up and Cool-down Performed as group-led instruction   Resistance Training Performed Yes   VAD Patient? No     Pain Assessment   Currently in Pain? No/denies   Multiple Pain Sites No      Capillary Blood Glucose: No results found for this or any previous visit (from the past 24 hour(s)).      Exercise Prescription Changes - 04/07/17 1200      Response to Exercise   Blood Pressure (Admit) 120/54   Blood Pressure (Exercise) 150/64   Blood Pressure (Exit) 110/60   Heart Rate (Admit) 74 bpm   Heart Rate (Exercise) 84 bpm   Heart Rate (Exit) 77 bpm   Oxygen Saturation (Admit) 96 %   Oxygen Saturation (Exercise) 97 %   Oxygen Saturation (Exit) 97 %   Rating of Perceived Exertion (Exercise) 11   Perceived Dyspnea (Exercise) 1   Duration Continue with 45 min of aerobic exercise without signs/symptoms of physical distress.   Intensity THRR unchanged     Resistance Training   Training Prescription Yes   Weight orange bands   Reps 10-15     Bike   Level 0.6   Minutes 17     NuStep   Level 2   Minutes 17   METs 1.6      History  Smoking Status  . Never Smoker  Smokeless Tobacco  . Never Used    Goals Met:  Exercise tolerated  well No report of cardiac concerns or symptoms Strength training completed today  Goals Unmet:  Not Applicable  Comments: Service time is from 10:30a to 12:00p    Dr. Rush Farmer is Medical Director for Pulmonary Rehab at [facility].

## 2017-04-12 ENCOUNTER — Encounter (HOSPITAL_COMMUNITY)
Admission: RE | Admit: 2017-04-12 | Discharge: 2017-04-12 | Disposition: A | Discharge status: Home / Self Care | Payer: Medicare Other | Source: Ambulatory Visit | Attending: Cardiology | Admitting: Cardiology

## 2017-04-12 VITALS — Wt 166.0 lb

## 2017-04-12 DIAGNOSIS — E119 Type 2 diabetes mellitus without complications: Secondary | ICD-10-CM | POA: Diagnosis not present

## 2017-04-12 DIAGNOSIS — Z7982 Long term (current) use of aspirin: Secondary | ICD-10-CM | POA: Diagnosis not present

## 2017-04-12 DIAGNOSIS — I509 Heart failure, unspecified: Secondary | ICD-10-CM | POA: Diagnosis not present

## 2017-04-12 DIAGNOSIS — I110 Hypertensive heart disease with heart failure: Secondary | ICD-10-CM | POA: Diagnosis not present

## 2017-04-12 DIAGNOSIS — Z79899 Other long term (current) drug therapy: Secondary | ICD-10-CM | POA: Diagnosis not present

## 2017-04-12 DIAGNOSIS — E785 Hyperlipidemia, unspecified: Secondary | ICD-10-CM | POA: Diagnosis not present

## 2017-04-12 NOTE — Progress Notes (Signed)
Daily Session Note  Patient Details  Name: Calvin Gray MRN: [id number] Date of Birth: May 10, 1926 Referring Provider:     Pulmonary Rehab Walk Test from 03/31/2017 in [place]  Referring Provider  Dr. Einar Gip      Encounter Date: 04/12/2017  Check In:     Session Check In - 04/12/17 1025      Check-In   Location [facility]   Staff Present Su Hilt, MS, ACSM RCEP, Exercise Physiologist;Shunte Senseney Ysidro Evert, RN   Supervising physician immediately available to respond to emergencies [facility] immediately available   Physician(s) Dr. Maryland Pink   Medication changes reported     No   Fall or balance concerns reported    No   Tobacco Cessation No Change   Warm-up and Cool-down Performed as group-led instruction   Resistance Training Performed Yes   VAD Patient? No     Pain Assessment   Currently in Pain? No/denies   Multiple Pain Sites No      Capillary Blood Glucose: No results found for this or any previous visit (from the past 24 hour(s)).      Exercise Prescription Changes - 04/12/17 1200      Response to Exercise   Blood Pressure (Admit) 128/4   Blood Pressure (Exercise) 142/60   Blood Pressure (Exit) 108/64   Heart Rate (Admit) 72 bpm   Heart Rate (Exercise) 90 bpm   Heart Rate (Exit) 70 bpm   Oxygen Saturation (Admit) 97 %   Oxygen Saturation (Exercise) 97 %   Oxygen Saturation (Exit) 95 %   Rating of Perceived Exertion (Exercise) 11   Perceived Dyspnea (Exercise) 1   Duration Continue with 45 min of aerobic exercise without signs/symptoms of physical distress.   Intensity THRR unchanged     Progression   Progression Continue to progress workloads to maintain intensity without signs/symptoms of physical distress.     Resistance Training   Training Prescription Yes   Weight orange bands   Reps 10-15   Time 10 Minutes     Bike   Level 0.5   Minutes 17     NuStep   Level 2   Minutes 17   METs  1.8     Track   Laps 10   Minutes 17      History  Smoking Status  . Never Smoker  Smokeless Tobacco  . Never Used    Goals Met:  Exercise tolerated well No report of cardiac concerns or symptoms Strength training completed today  Goals Unmet:  Not Applicable  Comments: Service time is from 1030 to 1200    Dr. Rush Farmer is Medical Director for Pulmonary Rehab at [facility].

## 2017-04-12 NOTE — Progress Notes (Signed)
Pulmonary Individual Treatment Plan  Patient Details  Name: Calvin Gray MRN: [id number] Date of Birth: 04-25-1926 Referring Provider:     Pulmonary Rehab Walk Test from 03/31/2017 in [place]  Referring Provider  Dr. Einar Gip      Initial Encounter Date:    Pulmonary Rehab Walk Test from 03/31/2017 in [place]  Date  04/01/17  Referring Provider  Dr. Einar Gip      Visit Diagnosis: Congestive heart failure, unspecified HF chronicity, unspecified heart failure type ([place])  Patient's Home Medications on Admission:   Current Outpatient Prescriptions:  .  alfuzosin (UROXATRAL) 10 MG 24 hr tablet, Take 10 mg by mouth 2 (two) times daily., Disp: , Rfl:  .  aspirin EC 81 MG tablet, Take 81 mg by mouth daily., Disp: , Rfl:  .  lisinopril-hydrochlorothiazide (PRINZIDE,ZESTORETIC) 10-12.5 MG per tablet, Take 1 tablet by mouth daily., Disp: , Rfl:  .  Multiple Vitamin (MULTIVITAMIN) tablet, Take 1 tablet by mouth daily., Disp: , Rfl:  .  pravastatin (PRAVACHOL) 80 MG tablet, Take 80 mg by mouth daily., Disp: , Rfl:  .  temazepam (RESTORIL) 15 MG capsule, Take 15 mg by mouth at bedtime as needed for sleep., Disp: , Rfl:   Past Medical History: Past Medical History:  Diagnosis Date  . Bradycardia, sinus 02/08/12  . Exertional dyspnea   . High cholesterol   . HOH (hard of hearing)   . Hypertension   . Type II diabetes mellitus ([place])    "keeps it controlled w/diet and exercise"    Tobacco Use: History  Smoking Status  . Never Smoker  Smokeless Tobacco  . Never Used    Labs: Recent Review Flowsheet Data    Labs for ITP Cardiac and Pulmonary Rehab Latest Ref Rng & Units 02/08/2012   Hemoglobin A1c <5.7 % 5.7(H)      Capillary Blood Glucose: Lab Results  Component Value Date   GLUCAP 107 (H) 02/09/2012   GLUCAP 98 02/08/2012   GLUCAP 81 02/08/2012   GLUCAP 94 02/08/2012   GLUCAP 96 02/08/2012     ADL [facility]SD:      Pulmonary Assessment Scores    Row Name 03/29/17 1142 04/01/17 1015       ADL UCSD   ADL Phase Entry Entry    SOB Score total 5  -      CAT Score   CAT Score 6  Entry  -      mMRC Score   mMRC Score  - 1       Pulmonary Function Assessment:     Pulmonary Function Assessment - 03/25/17 1422      Breath   Bilateral Breath Sounds Clear   Shortness of Breath Yes;Limiting activity      Exercise Target Goals:    Exercise Program Goal: Individual exercise prescription set with THRR, safety & activity barriers. Participant demonstrates ability to understand and report RPE using BORG scale, to self-measure pulse accurately, and to acknowledge the importance of the exercise prescription.  Exercise Prescription Goal: Starting with aerobic activity 30 plus minutes a day, 3 days per week for initial exercise prescription. Provide home exercise prescription and guidelines that participant acknowledges understanding prior to discharge.  Activity Barriers & Risk Stratification:   6 Minute Walk:     6 Minute Walk    Row Name 04/01/17 1015         6 Minute Walk   Phase Initial  Distance 900 feet     Walk Time 6 minutes     # of Rest Breaks 0     MPH 1.7     METS 2.3     RPE 11     Perceived Dyspnea  1     Symptoms No     Resting HR 75 bpm     Resting BP 113/69     Max Ex. HR 95 bpm     Max Ex. BP 145/71       Interval HR   Baseline HR 75     1 Minute HR 92     2 Minute HR 91     3 Minute HR 91     4 Minute HR 91     5 Minute HR 95     6 Minute HR 93     2 Minute Post HR 84     Interval Heart Rate? Yes       Interval Oxygen   Interval Oxygen? Yes     Baseline Oxygen Saturation % 96 %     Baseline Liters of Oxygen 0 L     1 Minute Oxygen Saturation % 96 %     1 Minute Liters of Oxygen 0 L     2 Minute Oxygen Saturation % 96 %     2 Minute Liters of Oxygen 0 L     3 Minute Oxygen Saturation % 96 %     3 Minute Liters of Oxygen 0 L     4 Minute  Oxygen Saturation % 96 %     4 Minute Liters of Oxygen 0 L     5 Minute Oxygen Saturation % 96 %     5 Minute Liters of Oxygen 0 L     6 Minute Oxygen Saturation % 96 %     6 Minute Liters of Oxygen 0 L     2 Minute Post Oxygen Saturation % 96 %     2 Minute Post Liters of Oxygen 0 L        Oxygen Initial Assessment:     Oxygen Initial Assessment - 04/01/17 1014      Initial 6 min Walk   Oxygen Used None   Resting Oxygen Saturation  during 6 min walk 96 %   Exercise Oxygen Saturation  during 6 min walk 96 %     Program Oxygen Prescription   Program Oxygen Prescription None      Oxygen Re-Evaluation:     Oxygen Re-Evaluation    Row Name 04/12/17 1404             Program Oxygen Prescription   Program Oxygen Prescription None         Home Oxygen   Home Oxygen Device None       Sleep Oxygen Prescription CPAP       Home Exercise Oxygen Prescription None       Home at Rest Exercise Oxygen Prescription None          Oxygen Discharge (Final Oxygen Re-Evaluation):     Oxygen Re-Evaluation - 04/12/17 1404      Program Oxygen Prescription   Program Oxygen Prescription None     Home Oxygen   Home Oxygen Device None   Sleep Oxygen Prescription CPAP   Home Exercise Oxygen Prescription None   Home at Rest Exercise Oxygen Prescription None      Initial Exercise Prescription:     Initial Exercise Prescription -  04/01/17 1000      Date of Initial Exercise RX and Referring Provider   Date 04/01/17   Referring Provider Dr. Einar Gip     Bike   Level 0.4   Minutes 17     NuStep   Level 2   Minutes 17   METs 1.5     Track   Laps 6   Minutes 17     Prescription Details   Frequency (times per week) 2   Duration Progress to 45 minutes of aerobic exercise without signs/symptoms of physical distress     Intensity   THRR 40-80% of Max Heartrate 52-105   Ratings of Perceived Exertion 11-13   Perceived Dyspnea 0-4     Progression   Progression Continue  progressive overload as per policy without signs/symptoms or physical distress.     Resistance Training   Training Prescription Yes   Weight orange bands   Reps 10-15      Perform Capillary Blood Glucose checks as needed.  Exercise Prescription Changes:     Exercise Prescription Changes    Row Name 04/07/17 1200 04/12/17 1200           Response to Exercise   Blood Pressure (Admit) 120/54 128/4      Blood Pressure (Exercise) 150/64 142/60      Blood Pressure (Exit) 110/60 108/64      Heart Rate (Admit) 74 bpm 72 bpm      Heart Rate (Exercise) 84 bpm 90 bpm      Heart Rate (Exit) 77 bpm 70 bpm      Oxygen Saturation (Admit) 96 % 97 %      Oxygen Saturation (Exercise) 97 % 97 %      Oxygen Saturation (Exit) 97 % 95 %      Rating of Perceived Exertion (Exercise) 11 11      Perceived Dyspnea (Exercise) 1 1      Duration Continue with 45 min of aerobic exercise without signs/symptoms of physical distress. Continue with 45 min of aerobic exercise without signs/symptoms of physical distress.      Intensity THRR unchanged THRR unchanged        Progression   Progression  - Continue to progress workloads to maintain intensity without signs/symptoms of physical distress.        Resistance Training   Training Prescription Yes Yes      Weight orange bands orange bands      Reps 10-15 10-15      Time  - 10 Minutes        Bike   Level 0.6 0.5      Minutes 17 17        NuStep   Level 2 2      Minutes 17 17      METs 1.6 1.8        Track   Laps  - 10      Minutes  - 17         Exercise Comments:   Exercise Goals and Review:     Exercise Goals    Row Name 03/25/17 1400             Exercise Goals   Increase Physical Activity Yes       Intervention Provide advice, education, support and counseling about physical activity/exercise needs.;Develop an individualized exercise prescription for aerobic and resistive training based on initial evaluation findings, risk  stratification, comorbidities and participant's personal goals.  Expected Outcomes Achievement of increased cardiorespiratory fitness and enhanced flexibility, muscular endurance and strength shown through measurements of functional capacity and personal statement of participant.       Increase Strength and Stamina Yes       Intervention Provide advice, education, support and counseling about physical activity/exercise needs.;Develop an individualized exercise prescription for aerobic and resistive training based on initial evaluation findings, risk stratification, comorbidities and participant's personal goals.       Expected Outcomes Achievement of increased cardiorespiratory fitness and enhanced flexibility, muscular endurance and strength shown through measurements of functional capacity and personal statement of participant.          Exercise Goals Re-Evaluation :     Exercise Goals Re-Evaluation    Row Name 04/08/17 1524             Exercise Goal Re-Evaluation   Exercise Goals Review Increase Physical Activity;Increase Strenth and Stamina       Comments Patient has only attended one exercise session. Will cont. to monitor and progress as able.       Expected Outcomes Through the exercise at rehab and at home, the patient will be able to increase physical activity, strength, and stamina.           Discharge Exercise Prescription (Final Exercise Prescription Changes):     Exercise Prescription Changes - 04/12/17 1200      Response to Exercise   Blood Pressure (Admit) 128/4   Blood Pressure (Exercise) 142/60   Blood Pressure (Exit) 108/64   Heart Rate (Admit) 72 bpm   Heart Rate (Exercise) 90 bpm   Heart Rate (Exit) 70 bpm   Oxygen Saturation (Admit) 97 %   Oxygen Saturation (Exercise) 97 %   Oxygen Saturation (Exit) 95 %   Rating of Perceived Exertion (Exercise) 11   Perceived Dyspnea (Exercise) 1   Duration Continue with 45 min of aerobic exercise without  signs/symptoms of physical distress.   Intensity THRR unchanged     Progression   Progression Continue to progress workloads to maintain intensity without signs/symptoms of physical distress.     Resistance Training   Training Prescription Yes   Weight orange bands   Reps 10-15   Time 10 Minutes     Bike   Level 0.5   Minutes 17     NuStep   Level 2   Minutes 17   METs 1.8     Track   Laps 10   Minutes 17      Nutrition:  Target Goals: Understanding of nutrition guidelines, daily intake of sodium 1500mg , cholesterol 200mg , calories 30% from fat and 7% or less from saturated fats, daily to have 5 or more servings of fruits and vegetables.  Biometrics:    Nutrition Therapy Plan and Nutrition Goals:   Nutrition Discharge: Rate Your Plate Scores:   Nutrition Goals Re-Evaluation:   Nutrition Goals Discharge (Final Nutrition Goals Re-Evaluation):   Psychosocial: Target Goals: Acknowledge presence or absence of significant depression and/or stress, maximize coping skills, provide positive support system. Participant is able to verbalize types and ability to use techniques and skills needed for reducing stress and depression.  Initial Review & Psychosocial Screening:     Initial Psych Review & Screening - 03/25/17 1422      Initial Review   Current issues with None Identified     Family Dynamics   Good Support System? Yes     Barriers   Psychosocial barriers to participate in program There are  no identifiable barriers or psychosocial needs.     Screening Interventions   Interventions Encouraged to exercise      Quality of Life Scores:   PHQ-9: Recent Review Flowsheet Data    Depression screen [facility] 2/9 03/25/2017   Decreased Interest 0   Down, Depressed, Hopeless 0   PHQ - 2 Score 0     Interpretation of Total Score  Total Score Depression Severity:  1-4 = Minimal depression, 5-9 = Mild depression, 10-14 = Moderate depression, 15-19 = Moderately  severe depression, 20-27 = Severe depression   Psychosocial Evaluation and Intervention:     Psychosocial Evaluation - 03/25/17 1423      Psychosocial Evaluation & Interventions   Interventions Encouraged to exercise with the program and follow exercise prescription   Continue Psychosocial Services  No Follow up required      Psychosocial Re-Evaluation:     Psychosocial Re-Evaluation    [place] Name 04/12/17 1406             Psychosocial Re-Evaluation   Current issues with None Identified       Expected Outcomes patient will remain free from psychosocial barriers to participation       Interventions Encouraged to attend Pulmonary Rehabilitation for the exercise       Continue Psychosocial Services  No Follow up required          Psychosocial Discharge (Final Psychosocial Re-Evaluation):     Psychosocial Re-Evaluation - 04/12/17 1406      Psychosocial Re-Evaluation   Current issues with None Identified   Expected Outcomes patient will remain free from psychosocial barriers to participation   Interventions Encouraged to attend Pulmonary Rehabilitation for the exercise   Continue Psychosocial Services  No Follow up required      Education: Education Goals: Education classes will be provided on a weekly basis, covering required topics. Participant will state understanding/return demonstration of topics presented.  Learning Barriers/Preferences:     Learning Barriers/Preferences - 03/25/17 1417      Learning Barriers/Preferences   Learning Barriers None   Learning Preferences Written Material;Group Instruction;Individual Instruction;Skilled Demonstration;Verbal Instruction      Education Topics: Risk Factor Reduction:  -Group instruction that is supported by a PowerPoint presentation. Instructor discusses the definition of a risk factor, different risk factors for pulmonary disease, and how the heart and lungs work together.     PULMONARY REHAB OTHER RESPIRATORY  from 04/07/2017 in [place]  Date  04/07/17  Educator  EP  Instruction Review Code  2- meets goals/outcomes      Nutrition for Pulmonary Patient:  -Group instruction provided by PowerPoint slides, verbal discussion, and written materials to support subject matter. The instructor gives an explanation and review of healthy diet recommendations, which includes a discussion on weight management, recommendations for fruit and vegetable consumption, as well as protein, fluid, caffeine, fiber, sodium, sugar, and alcohol. Tips for eating when patients are short of breath are discussed.   Pursed Lip Breathing:  -Group instruction that is supported by demonstration and informational handouts. Instructor discusses the benefits of pursed lip and diaphragmatic breathing and detailed demonstration on how to preform both.     Oxygen Safety:  -Group instruction provided by PowerPoint, verbal discussion, and written material to support subject matter. There is an overview of "What is Oxygen" and "Why do we need it".  Instructor also reviews how to create a safe environment for oxygen use, the importance of using oxygen as prescribed, and  the risks of noncompliance. There is a brief discussion on traveling with oxygen and resources the patient may utilize.   Oxygen Equipment:  -Group instruction provided by [facility] Staff utilizing handouts, written materials, and equipment demonstrations.   Signs and Symptoms:  -Group instruction provided by written material and verbal discussion to support subject matter. Warning signs and symptoms of infection, stroke, and heart attack are reviewed and when to call the physician/911 reinforced. Tips for preventing the spread of infection discussed.   Advanced Directives:  -Group instruction provided by verbal instruction and written material to support subject matter. Instructor reviews Advanced Directive laws and proper instruction  for filling out document.   Pulmonary Video:  -Group video education that reviews the importance of medication and oxygen compliance, exercise, good nutrition, pulmonary hygiene, and pursed lip and diaphragmatic breathing for the pulmonary patient.   Exercise for the Pulmonary Patient:  -Group instruction that is supported by a PowerPoint presentation. Instructor discusses benefits of exercise, core components of exercise, frequency, duration, and intensity of an exercise routine, importance of utilizing pulse oximetry during exercise, safety while exercising, and options of places to exercise outside of rehab.     Pulmonary Medications:  -Verbally interactive group education provided by instructor with focus on inhaled medications and proper administration.   Anatomy and Physiology of the Respiratory System and Intimacy:  -Group instruction provided by PowerPoint, verbal discussion, and written material to support subject matter. Instructor reviews respiratory cycle and anatomical components of the respiratory system and their functions. Instructor also reviews differences in obstructive and restrictive respiratory diseases with examples of each. Intimacy, Sex, and Sexuality differences are reviewed with a discussion on how relationships can change when diagnosed with pulmonary disease. Common sexual concerns are reviewed.   MD DAY -A group question and answer session with a medical doctor that allows participants to ask questions that relate to their pulmonary disease state.   OTHER EDUCATION -Group or individual verbal, written, or video instructions that support the educational goals of the pulmonary rehab program.   Knowledge Questionnaire Score:     Knowledge Questionnaire Score - 04/12/17 0856      Knowledge Questionnaire Score   Pre Score 13/13      Core Components/Risk Factors/Patient Goals at Admission:     Personal Goals and Risk Factors at Admission - 03/25/17 1423       Core Components/Risk Factors/Patient Goals on Admission   Improve shortness of breath with ADL's Yes   Intervention Provide education, individualized exercise plan and daily activity instruction to help decrease symptoms of SOB with activities of daily living.   Expected Outcomes Short Term: Achieves a reduction of symptoms when performing activities of daily living.   Heart Failure Yes   Intervention Provide a combined exercise and nutrition program that is supplemented with education, support and counseling about heart failure. Directed toward relieving symptoms such as shortness of breath, decreased exercise tolerance, and extremity edema.   Expected Outcomes Improve functional capacity of life;Short term: Attendance in program 2-3 days a week with increased exercise capacity. Reported lower sodium intake. Reported increased fruit and vegetable intake. Reports medication compliance.;Short term: Daily weights obtained and reported for increase. Utilizing diuretic protocols set by physician.;Long term: Adoption of self-care skills and reduction of barriers for early signs and symptoms recognition and intervention leading to self-care maintenance.      Core Components/Risk Factors/Patient Goals Review:      Goals and Risk Factor Review    Row Name 04/12/17  1405             Core Components/Risk Factors/Patient Goals Review   Personal Goals Review Improve shortness of breath with ADL's;Heart Failure       Review patient has only attended 2 sessions since admission and it is to soon to evaluate progress towards goals       Expected Outcomes see admission expected outcomes          Core Components/Risk Factors/Patient Goals at Discharge (Final Review):      Goals and Risk Factor Review - 04/12/17 1405      Core Components/Risk Factors/Patient Goals Review   Personal Goals Review Improve shortness of breath with ADL's;Heart Failure   Review patient has only attended 2 sessions  since admission and it is to soon to evaluate progress towards goals   Expected Outcomes see admission expected outcomes      ITP Comments:   Comments: ITP REVIEW Pt is making expected progress toward pulmonary rehab goals after completing 2 sessions. Recommend continued exercise, life style modification, education, and utilization of breathing techniques to increase stamina and strength and decrease shortness of breath with exertion.

## 2017-04-14 ENCOUNTER — Encounter (HOSPITAL_COMMUNITY)
Admission: RE | Admit: 2017-04-14 | Discharge: 2017-04-14 | Disposition: A | Discharge status: Home / Self Care | Payer: Medicare Other | Source: Ambulatory Visit | Attending: Cardiology | Admitting: Cardiology

## 2017-04-14 VITALS — Wt 166.4 lb

## 2017-04-14 DIAGNOSIS — I110 Hypertensive heart disease with heart failure: Secondary | ICD-10-CM | POA: Insufficient documentation

## 2017-04-14 DIAGNOSIS — Z7982 Long term (current) use of aspirin: Secondary | ICD-10-CM | POA: Insufficient documentation

## 2017-04-14 DIAGNOSIS — E119 Type 2 diabetes mellitus without complications: Secondary | ICD-10-CM | POA: Insufficient documentation

## 2017-04-14 DIAGNOSIS — H9190 Unspecified hearing loss, unspecified ear: Secondary | ICD-10-CM | POA: Diagnosis not present

## 2017-04-14 DIAGNOSIS — Z79899 Other long term (current) drug therapy: Secondary | ICD-10-CM | POA: Insufficient documentation

## 2017-04-14 DIAGNOSIS — I509 Heart failure, unspecified: Secondary | ICD-10-CM | POA: Diagnosis not present

## 2017-04-14 DIAGNOSIS — E785 Hyperlipidemia, unspecified: Secondary | ICD-10-CM | POA: Diagnosis not present

## 2017-04-14 NOTE — Progress Notes (Signed)
Daily Session Note  Patient Details  Name: Calvin Gray MRN: [id number] Date of Birth: 01/17/1926 Referring Provider:     Pulmonary Rehab Walk Test from 03/31/2017 in [place]  Referring Provider  Dr. Einar Gip      Encounter Date: 04/14/2017  Check In:     Session Check In - 04/14/17 1049      Check-In   Location [facility]   Staff Present Su Hilt, MS, ACSM RCEP, Exercise Physiologist;Lisa Ysidro Evert, RN;Portia Rollene Rotunda, RN, BSN   Supervising physician immediately available to respond to emergencies [facility] immediately available   Physician(s) Dr. Jonnie Finner   Medication changes reported     No   Fall or balance concerns reported    No   Tobacco Cessation No Change   Warm-up and Cool-down Performed as group-led instruction   Resistance Training Performed Yes   VAD Patient? No     Pain Assessment   Currently in Pain? No/denies   Multiple Pain Sites No      Capillary Blood Glucose: No results found for this or any previous visit (from the past 24 hour(s)).      Exercise Prescription Changes - 04/14/17 1200      Response to Exercise   Blood Pressure (Admit) 122/58   Blood Pressure (Exercise) 134/70   Blood Pressure (Exit) 100/58   Heart Rate (Admit) 78 bpm   Heart Rate (Exercise) 93 bpm   Heart Rate (Exit) 71 bpm   Oxygen Saturation (Admit) 98 %   Oxygen Saturation (Exercise) 96 %   Oxygen Saturation (Exit) 96 %   Rating of Perceived Exertion (Exercise) 11   Perceived Dyspnea (Exercise) 1   Duration Continue with 45 min of aerobic exercise without signs/symptoms of physical distress.   Intensity THRR unchanged     Progression   Progression Continue to progress workloads to maintain intensity without signs/symptoms of physical distress.     Resistance Training   Training Prescription Yes   Weight orange bands   Reps 10-15   Time 10 Minutes     NuStep   Level 2   Minutes 17   METs 1.9     Track   Laps 11   Minutes 17      History  Smoking Status  . Never Smoker  Smokeless Tobacco  . Never Used    Goals Met:  Exercise tolerated well No report of cardiac concerns or symptoms Strength training completed today  Goals Unmet:  Not Applicable  Comments: Service time is from 10:30a to 12:15p    Dr. Rush Farmer is Medical Director for Pulmonary Rehab at [facility].

## 2017-04-19 ENCOUNTER — Encounter (HOSPITAL_COMMUNITY)
Admission: RE | Admit: 2017-04-19 | Discharge: 2017-04-19 | Disposition: A | Discharge status: Home / Self Care | Payer: Medicare Other | Source: Ambulatory Visit | Attending: Cardiology | Admitting: Cardiology

## 2017-04-19 VITALS — Wt 167.1 lb

## 2017-04-19 DIAGNOSIS — I110 Hypertensive heart disease with heart failure: Secondary | ICD-10-CM | POA: Diagnosis not present

## 2017-04-19 DIAGNOSIS — Z7982 Long term (current) use of aspirin: Secondary | ICD-10-CM | POA: Diagnosis not present

## 2017-04-19 DIAGNOSIS — I509 Heart failure, unspecified: Secondary | ICD-10-CM

## 2017-04-19 DIAGNOSIS — E785 Hyperlipidemia, unspecified: Secondary | ICD-10-CM | POA: Diagnosis not present

## 2017-04-19 DIAGNOSIS — Z79899 Other long term (current) drug therapy: Secondary | ICD-10-CM | POA: Diagnosis not present

## 2017-04-19 DIAGNOSIS — E119 Type 2 diabetes mellitus without complications: Secondary | ICD-10-CM | POA: Diagnosis not present

## 2017-04-19 NOTE — Progress Notes (Signed)
Daily Session Note  Patient Details  Name: Calvin Gray MRN: [id number] Date of Birth: [birth year] Referring Provider:     Pulmonary Rehab Walk Test from 03/31/2017 in [place]  Referring Provider  Dr. Einar Gip      Encounter Date: 04/19/2017  Check In:     Session Check In - 04/19/17 1211      Check-In   Location [facility]   Staff Present Su Hilt, MS, ACSM RCEP, Exercise Physiologist;Lisa Ysidro Evert, RN;Daphyne Miguez Rollene Rotunda, RN, BSN   Supervising physician immediately available to respond to emergencies [facility] immediately available   Physician(s) Dr. Allyson Sabal   Medication changes reported     No   Fall or balance concerns reported    No   Tobacco Cessation No Change   Warm-up and Cool-down Performed as group-led instruction   Resistance Training Performed Yes   VAD Patient? No     Pain Assessment   Currently in Pain? No/denies   Multiple Pain Sites No      Capillary Blood Glucose: No results found for this or any previous visit (from the past 24 hour(s)).      Exercise Prescription Changes - 04/19/17 1228      Response to Exercise   Blood Pressure (Admit) 118/50   Blood Pressure (Exercise) 120/60   Blood Pressure (Exit) 122/60   Heart Rate (Admit) 75 bpm   Heart Rate (Exercise) 99 bpm   Heart Rate (Exit) 94 bpm   Oxygen Saturation (Admit) 98 %   Oxygen Saturation (Exercise) 97 %   Oxygen Saturation (Exit) 97 %   Rating of Perceived Exertion (Exercise) 13   Perceived Dyspnea (Exercise) 1   Duration Continue with 45 min of aerobic exercise without signs/symptoms of physical distress.   Intensity THRR unchanged     Progression   Progression Continue to progress workloads to maintain intensity without signs/symptoms of physical distress.     Resistance Training   Training Prescription Yes   Weight orange bands   Reps 10-15   Time 10 Minutes     Bike   Level 0.6   Minutes 17     NuStep   Level 3   Minutes 17   METs 1.9     Track   Laps 7   Minutes 17      History  Smoking Status  . Never Smoker  Smokeless Tobacco  . Never Used    Goals Met:  Improved SOB with ADL's Exercise tolerated well Queuing for purse lip breathing No report of cardiac concerns or symptoms Strength training completed today  Goals Unmet:  Not Applicable  Comments: Service time is from 1030 to 1205   Dr. Rush Farmer is Medical Director for Pulmonary Rehab at [facility].

## 2017-04-21 ENCOUNTER — Encounter (HOSPITAL_COMMUNITY)
Admission: RE | Admit: 2017-04-21 | Discharge: 2017-04-21 | Disposition: A | Discharge status: Home / Self Care | Payer: Medicare Other | Source: Ambulatory Visit | Attending: Cardiology | Admitting: Cardiology

## 2017-04-21 VITALS — Wt 167.3 lb

## 2017-04-21 DIAGNOSIS — Z79899 Other long term (current) drug therapy: Secondary | ICD-10-CM | POA: Diagnosis not present

## 2017-04-21 DIAGNOSIS — I509 Heart failure, unspecified: Secondary | ICD-10-CM

## 2017-04-21 DIAGNOSIS — E785 Hyperlipidemia, unspecified: Secondary | ICD-10-CM | POA: Diagnosis not present

## 2017-04-21 DIAGNOSIS — E119 Type 2 diabetes mellitus without complications: Secondary | ICD-10-CM | POA: Diagnosis not present

## 2017-04-21 DIAGNOSIS — Z7982 Long term (current) use of aspirin: Secondary | ICD-10-CM | POA: Diagnosis not present

## 2017-04-21 DIAGNOSIS — I110 Hypertensive heart disease with heart failure: Secondary | ICD-10-CM | POA: Diagnosis not present

## 2017-04-21 NOTE — Progress Notes (Signed)
Daily Session Note  Patient Details  Name: Calvin Gray MRN: [id number] Date of Birth: 1925-12-20 Referring Provider:     Pulmonary Rehab Walk Test from 03/31/2017 in [place]  Referring Provider  Dr. Einar Gip      Encounter Date: 04/21/2017  Check In:     Session Check In - 04/21/17 1046      Check-In   [facility]   Staff Present Su Hilt, MS, ACSM RCEP, Exercise Physiologist;Portia Rollene Rotunda, RN, Roque Cash, RN   Supervising physician immediately available to respond to emergencies [facility] immediately available   Physician(s) Dr. Clementeen Graham   Medication changes reported     No   Fall or balance concerns reported    No   Tobacco Cessation No Change   Warm-up and Cool-down Performed as group-led instruction   Resistance Training Performed Yes   VAD Patient? No     Pain Assessment   Currently in Pain? No/denies   Multiple Pain Sites No      Capillary Blood Glucose: No results found for this or any previous visit (from the past 24 hour(s)).      Exercise Prescription Changes - 04/21/17 1200      Response to Exercise   Blood Pressure (Admit) 100/44   Blood Pressure (Exit) 108/68   Heart Rate (Admit) 71 bpm   Heart Rate (Exercise) 83 bpm   Heart Rate (Exit) 70 bpm   Oxygen Saturation (Admit) 97 %   Oxygen Saturation (Exercise) 97 %   Oxygen Saturation (Exit) 97 %   Rating of Perceived Exertion (Exercise) 11   Perceived Dyspnea (Exercise) 1   Duration Continue with 45 min of aerobic exercise without signs/symptoms of physical distress.   Intensity THRR unchanged     Progression   Progression Continue to progress workloads to maintain intensity without signs/symptoms of physical distress.     Resistance Training   Training Prescription Yes   Weight orange bands   Reps 10-15   Time 10 Minutes     NuStep   Level 3   Minutes 17   METs 2     Track   Laps 8   Minutes 17      History   Smoking Status  . Never Smoker  Smokeless Tobacco  . Never Used    Goals Met:  Exercise tolerated well No report of cardiac concerns or symptoms Strength training completed today  Goals Unmet:  Not Applicable  Comments: Service time is from 1030 to 1240    Dr. Rush Farmer is Medical Director for Pulmonary Rehab at [facility].

## 2017-04-26 ENCOUNTER — Encounter (HOSPITAL_COMMUNITY)
Admission: RE | Admit: 2017-04-26 | Discharge: 2017-04-26 | Disposition: A | Discharge status: Home / Self Care | Payer: Medicare Other | Source: Ambulatory Visit | Attending: Cardiology | Admitting: Cardiology

## 2017-04-26 VITALS — Wt 168.0 lb

## 2017-04-26 DIAGNOSIS — I110 Hypertensive heart disease with heart failure: Secondary | ICD-10-CM | POA: Diagnosis not present

## 2017-04-26 DIAGNOSIS — I509 Heart failure, unspecified: Secondary | ICD-10-CM | POA: Diagnosis not present

## 2017-04-26 DIAGNOSIS — Z79899 Other long term (current) drug therapy: Secondary | ICD-10-CM | POA: Diagnosis not present

## 2017-04-26 DIAGNOSIS — Z7982 Long term (current) use of aspirin: Secondary | ICD-10-CM | POA: Diagnosis not present

## 2017-04-26 DIAGNOSIS — E785 Hyperlipidemia, unspecified: Secondary | ICD-10-CM | POA: Diagnosis not present

## 2017-04-26 DIAGNOSIS — E119 Type 2 diabetes mellitus without complications: Secondary | ICD-10-CM | POA: Diagnosis not present

## 2017-04-26 NOTE — Progress Notes (Signed)
Daily Session Note  Patient Details  Name: Calvin Gray MRN: [id number] Date of Birth: 11-18-1925 Referring Provider:     Pulmonary Rehab Walk Test from 03/31/2017 in [place]  Referring Provider  Dr. Einar Gip      Encounter Date: 04/26/2017  Check In:     Session Check In - 04/26/17 1021      Check-In   [facility]   Staff Present Rodney Langton, RN;Michille Mcelrath, MS, ACSM RCEP, Exercise Physiologist   Supervising physician immediately available to respond to emergencies [facility] immediately available   Physician(s) Dr. Clementeen Graham   Medication changes reported     No   Fall or balance concerns reported    No   Tobacco Cessation No Change   Warm-up and Cool-down Performed as group-led instruction   Resistance Training Performed Yes   VAD Patient? No     Pain Assessment   Currently in Pain? No/denies   Multiple Pain Sites No      Capillary Blood Glucose: No results found for this or any previous visit (from the past 24 hour(s)).      Exercise Prescription Changes - 04/26/17 1200      Response to Exercise   Blood Pressure (Admit) 120/54   Blood Pressure (Exercise) 110/54   Blood Pressure (Exit) 108/68   Heart Rate (Admit) 81 bpm   Heart Rate (Exercise) 108 bpm   Heart Rate (Exit) 60 bpm   Oxygen Saturation (Admit) 97 %   Oxygen Saturation (Exercise) 96 %   Oxygen Saturation (Exit) 96 %   Rating of Perceived Exertion (Exercise) 12   Perceived Dyspnea (Exercise) 1   Duration Continue with 45 min of aerobic exercise without signs/symptoms of physical distress.   Intensity THRR unchanged     Progression   Progression Continue to progress workloads to maintain intensity without signs/symptoms of physical distress.     Resistance Training   Training Prescription Yes   Weight orange bands   Reps 10-15   Time 10 Minutes     Bike   Level 0.8   Minutes 17     NuStep   Level 4   Minutes 17   METs  2.6     Track   Laps 10   Minutes 17      History  Smoking Status  . Never Smoker  Smokeless Tobacco  . Never Used    Goals Met:  Exercise tolerated well No report of cardiac concerns or symptoms Strength training completed today  Goals Unmet:  Not Applicable  Comments: Service time is from 10:30a to 12:10p    Dr. Rush Farmer is Medical Director for Pulmonary Rehab at [facility].

## 2017-04-28 ENCOUNTER — Encounter (HOSPITAL_COMMUNITY)
Admission: RE | Admit: 2017-04-28 | Discharge: 2017-04-28 | Disposition: A | Discharge status: Home / Self Care | Payer: Medicare Other | Source: Ambulatory Visit | Attending: Cardiology | Admitting: Cardiology

## 2017-04-28 VITALS — Wt 169.1 lb

## 2017-04-28 DIAGNOSIS — I509 Heart failure, unspecified: Secondary | ICD-10-CM | POA: Diagnosis not present

## 2017-04-28 DIAGNOSIS — E785 Hyperlipidemia, unspecified: Secondary | ICD-10-CM | POA: Diagnosis not present

## 2017-04-28 DIAGNOSIS — I110 Hypertensive heart disease with heart failure: Secondary | ICD-10-CM | POA: Diagnosis not present

## 2017-04-28 DIAGNOSIS — Z7982 Long term (current) use of aspirin: Secondary | ICD-10-CM | POA: Diagnosis not present

## 2017-04-28 DIAGNOSIS — Z79899 Other long term (current) drug therapy: Secondary | ICD-10-CM | POA: Diagnosis not present

## 2017-04-28 DIAGNOSIS — E119 Type 2 diabetes mellitus without complications: Secondary | ICD-10-CM | POA: Diagnosis not present

## 2017-04-28 NOTE — Progress Notes (Signed)
Daily Session Note  Patient Details  Name: Calvin Gray MRN: [id number] Date of Birth: 1925/10/10 Referring Provider:     Pulmonary Rehab Walk Test from 03/31/2017 in [place]  Referring Provider  Dr. Einar Gip      Encounter Date: 04/28/2017  Check In:     Session Check In - 04/28/17 1021      Check-In   [facility]   Staff Present Rodney Langton, RN;Perri Aragones, MS, ACSM RCEP, Exercise Physiologist;Portia Rollene Rotunda, RN, BSN   Supervising physician immediately available to respond to emergencies [facility] immediately available   Physician(s) Dr. Wendee Beavers   Medication changes reported     No   Fall or balance concerns reported    No   Tobacco Cessation No Change   Warm-up and Cool-down Performed as group-led instruction   Resistance Training Performed Yes   VAD Patient? No     Pain Assessment   Currently in Pain? No/denies   Multiple Pain Sites No      Capillary Blood Glucose: No results found for this or any previous visit (from the past 24 hour(s)).      Exercise Prescription Changes - 04/28/17 1200      Response to Exercise   Blood Pressure (Admit) 110/60   Blood Pressure (Exercise) 144/76   Blood Pressure (Exit) 100/60   Heart Rate (Admit) 73 bpm   Heart Rate (Exercise) 92 bpm   Heart Rate (Exit) 75 bpm   Oxygen Saturation (Admit) 98 %   Oxygen Saturation (Exercise) 98 %   Oxygen Saturation (Exit) 96 %   Rating of Perceived Exertion (Exercise) 13   Perceived Dyspnea (Exercise) 1   Duration Continue with 45 min of aerobic exercise without signs/symptoms of physical distress.   Intensity THRR unchanged     Progression   Progression Continue to progress workloads to maintain intensity without signs/symptoms of physical distress.     Resistance Training   Training Prescription Yes   Weight orange bands   Reps 10-15   Time 10 Minutes     Bike   Level 0.8   Minutes 17     NuStep   Level 4   Minutes 17   METs 2.5     Track   Laps 12   Minutes 17      History  Smoking Status  . Never Smoker  Smokeless Tobacco  . Never Used    Goals Met:  Exercise tolerated well No report of cardiac concerns or symptoms Strength training completed today  Goals Unmet:  Not Applicable  Comments: Service time is from 10:30a to 12:10p    Dr. Rush Farmer is Medical Director for Pulmonary Rehab at [facility].

## 2017-05-03 ENCOUNTER — Encounter (HOSPITAL_COMMUNITY)
Admission: RE | Admit: 2017-05-03 | Discharge: 2017-05-03 | Disposition: A | Discharge status: Home / Self Care | Payer: Medicare Other | Source: Ambulatory Visit | Attending: Cardiology | Admitting: Cardiology

## 2017-05-03 VITALS — Wt 168.2 lb

## 2017-05-03 DIAGNOSIS — I110 Hypertensive heart disease with heart failure: Secondary | ICD-10-CM | POA: Diagnosis not present

## 2017-05-03 DIAGNOSIS — E119 Type 2 diabetes mellitus without complications: Secondary | ICD-10-CM | POA: Diagnosis not present

## 2017-05-03 DIAGNOSIS — I509 Heart failure, unspecified: Secondary | ICD-10-CM

## 2017-05-03 DIAGNOSIS — Z79899 Other long term (current) drug therapy: Secondary | ICD-10-CM | POA: Diagnosis not present

## 2017-05-03 DIAGNOSIS — E785 Hyperlipidemia, unspecified: Secondary | ICD-10-CM | POA: Diagnosis not present

## 2017-05-03 DIAGNOSIS — Z7982 Long term (current) use of aspirin: Secondary | ICD-10-CM | POA: Diagnosis not present

## 2017-05-03 NOTE — Progress Notes (Signed)
Daily Session Note  Patient Details  Name: Calvin Gray MRN: [id number] Date of Birth: [birth year] Referring Provider:     Pulmonary Rehab Walk Test from 03/31/2017 in [place]  Referring Provider  Dr. Einar Gip      Encounter Date: 05/03/2017  Check In:     Session Check In - 05/03/17 1208      Check-In   [facility]   Staff Present Su Hilt, MS, ACSM RCEP, Exercise Physiologist;Lisa Ysidro Evert, RN;Other;Portia Rollene Rotunda, RN, BSN   Supervising physician immediately available to respond to emergencies [facility] immediately available   Physician(s) Dr. Wendee Beavers   Medication changes reported     No   Fall or balance concerns reported    No   Tobacco Cessation No Change   Warm-up and Cool-down Performed as group-led instruction   Resistance Training Performed Yes   VAD Patient? No     Pain Assessment   Currently in Pain? No/denies   Multiple Pain Sites No      Capillary Blood Glucose: No results found for this or any previous visit (from the past 24 hour(s)).      Exercise Prescription Changes - 05/03/17 1200      Response to Exercise   Blood Pressure (Admit) 110/64   Blood Pressure (Exercise) 130/66   Blood Pressure (Exit) 102/60   Heart Rate (Admit) 70 bpm   Heart Rate (Exercise) 106 bpm   Heart Rate (Exit) 79 bpm   Oxygen Saturation (Admit) 99 %   Oxygen Saturation (Exercise) 96 %   Oxygen Saturation (Exit) 97 %   Rating of Perceived Exertion (Exercise) 11   Perceived Dyspnea (Exercise) 3   Duration Continue with 45 min of aerobic exercise without signs/symptoms of physical distress.   Intensity THRR unchanged     Progression   Progression Continue to progress workloads to maintain intensity without signs/symptoms of physical distress.     Resistance Training   Training Prescription Yes   Weight orange bands   Reps 10-15   Time 10 Minutes     Bike   Level 0.7   Minutes 17     NuStep   Level  4   Minutes 17   METs 2.5     Track   Laps 8   Minutes 17      History  Smoking Status  . Never Smoker  Smokeless Tobacco  . Never Used    Goals Met:  Exercise tolerated well No report of cardiac concerns or symptoms Strength training completed today  Goals Unmet:  Not Applicable  Comments: Service time is from 10:30a to 12:10p    Dr. Rush Farmer is Medical Director for Pulmonary Rehab at [facility].

## 2017-05-05 ENCOUNTER — Encounter (HOSPITAL_COMMUNITY)
Admission: RE | Admit: 2017-05-05 | Discharge: 2017-05-05 | Disposition: A | Discharge status: Home / Self Care | Payer: Medicare Other | Source: Ambulatory Visit | Attending: Cardiology | Admitting: Cardiology

## 2017-05-05 VITALS — Wt 167.5 lb

## 2017-05-05 DIAGNOSIS — I509 Heart failure, unspecified: Secondary | ICD-10-CM

## 2017-05-05 DIAGNOSIS — E785 Hyperlipidemia, unspecified: Secondary | ICD-10-CM | POA: Diagnosis not present

## 2017-05-05 DIAGNOSIS — I110 Hypertensive heart disease with heart failure: Secondary | ICD-10-CM | POA: Diagnosis not present

## 2017-05-05 DIAGNOSIS — Z79899 Other long term (current) drug therapy: Secondary | ICD-10-CM | POA: Diagnosis not present

## 2017-05-05 DIAGNOSIS — Z7982 Long term (current) use of aspirin: Secondary | ICD-10-CM | POA: Diagnosis not present

## 2017-05-05 DIAGNOSIS — E119 Type 2 diabetes mellitus without complications: Secondary | ICD-10-CM | POA: Diagnosis not present

## 2017-05-05 NOTE — Progress Notes (Signed)
Daily Session Note  Patient Details  Name: Calvin Gray MRN: [id number] Date of Birth: 1926-01-02 Referring Provider:     Pulmonary Rehab Walk Test from 03/31/2017 in [place]  Referring Provider  Dr. Einar Gip      Encounter Date: 05/05/2017  Check In:     Session Check In - 05/05/17 1027      Check-In   Location [facility]   Staff Present Trish Fountain, RN, BSN;Lisa Ysidro Evert, RN;Molly diVincenzo, MS, ACSM RCEP, Exercise Physiologist   Supervising physician immediately available to respond to emergencies [facility] immediately available   Physician(s) Dr. Clementeen Graham   Medication changes reported     No   Fall or balance concerns reported    No   Tobacco Cessation No Change   Warm-up and Cool-down Performed as group-led instruction   Resistance Training Performed Yes   VAD Patient? No     Pain Assessment   Currently in Pain? No/denies   Multiple Pain Sites No      Capillary Blood Glucose: No results found for this or any previous visit (from the past 24 hour(s)).      Exercise Prescription Changes - 05/05/17 1248      Response to Exercise   Blood Pressure (Admit) 110/60   Blood Pressure (Exercise) 130/60   Blood Pressure (Exit) 108/70   Heart Rate (Admit) 66 bpm   Heart Rate (Exercise) 86 bpm   Heart Rate (Exit) 72 bpm   Oxygen Saturation (Admit) 98 %   Oxygen Saturation (Exercise) 97 %   Oxygen Saturation (Exit) 97 %   Rating of Perceived Exertion (Exercise) 12   Perceived Dyspnea (Exercise) 2   Duration Continue with 45 min of aerobic exercise without signs/symptoms of physical distress.   Intensity THRR unchanged     Progression   Progression Continue to progress workloads to maintain intensity without signs/symptoms of physical distress.     Resistance Training   Training Prescription Yes   Weight orange bands   Reps 10-15   Time 10 Minutes     NuStep   Level 4   Minutes 17   METs 2.3     Track   Laps 9   Minutes 17      History  Smoking Status  . Never Smoker  Smokeless Tobacco  . Never Used    Goals Met:  Independence with exercise equipment Improved SOB with ADL's Using PLB without cueing & demonstrates good technique Exercise tolerated well No report of cardiac concerns or symptoms Strength training completed today  Goals Unmet:  Not Applicable  Comments: Service time is from 1030 to 1230   Dr. Rush Farmer is Medical Director for Pulmonary Rehab at [facility].

## 2017-05-09 NOTE — Progress Notes (Signed)
Calvin Gray 83 y.o. male   DOB: Oct 07, 1925 MRN: [id number]          Nutrition Note 1. Congestive heart failure, unspecified HF chronicity, unspecified heart failure type [facility])    Past Medical History:  Diagnosis Date  . Bradycardia, sinus 02/08/12  . Exertional dyspnea   . High cholesterol   . HOH (hard of hearing)   . Hypertension   . Type II diabetes mellitus ([place])    "keeps it controlled w/diet and exercise"   Meds reviewed.   Ht: Ht Readings from Last 1 Encounters:  03/25/17 _0  (1.753 m)   Wt:  Wt Readings from Last 3 Encounters:  05/05/17 167 lb 8.8 oz (76 kg)  05/03/17 168 lb 3.4 oz (76.3 kg)  04/28/17 169 lb 1.5 oz (76.7 kg)    BMI: 24.9    Current tobacco use? No  Labs:  Lipid Panel  No results found for: CHOL, TRIG, HDL, CHOLHDL, VLDL, LDLCALC, LDLDIRECT  Brief Note Pt is at a normal wt. There are some ways the pt can make his eating habits healthier.  Pt does not avoid salty food; uses canned/ convenience food.    Nutrition Diagnosis ? Food-and nutrition-related knowledge deficit related to lack of exposure to information as related to diagnosis of pulmonary disease  Goal(s) 1. Describe the benefit of including fruits, vegetables, whole grains, and low-fat dairy products in a healthy meal plan. Plan:  Pt to attend Pulmonary Nutrition class - met; 05/05/17 Will provide client-centered nutrition education as part of interdisciplinary care.   Monitor and evaluate progress toward nutrition goal with team.  Monitor and Evaluate progress toward nutrition goal with team.   Derek Mound, M.Ed, RD, LDN, CDE 05/09/2017 11:46 AM

## 2017-05-10 ENCOUNTER — Encounter (HOSPITAL_COMMUNITY)
Admission: RE | Admit: 2017-05-10 | Discharge: 2017-05-10 | Disposition: A | Discharge status: Home / Self Care | Payer: Medicare Other | Source: Ambulatory Visit | Attending: Cardiology | Admitting: Cardiology

## 2017-05-10 VITALS — Wt 166.4 lb

## 2017-05-10 DIAGNOSIS — Z79899 Other long term (current) drug therapy: Secondary | ICD-10-CM | POA: Diagnosis not present

## 2017-05-10 DIAGNOSIS — Z7982 Long term (current) use of aspirin: Secondary | ICD-10-CM | POA: Diagnosis not present

## 2017-05-10 DIAGNOSIS — I110 Hypertensive heart disease with heart failure: Secondary | ICD-10-CM | POA: Diagnosis not present

## 2017-05-10 DIAGNOSIS — I509 Heart failure, unspecified: Secondary | ICD-10-CM | POA: Diagnosis not present

## 2017-05-10 DIAGNOSIS — E119 Type 2 diabetes mellitus without complications: Secondary | ICD-10-CM | POA: Diagnosis not present

## 2017-05-10 DIAGNOSIS — E785 Hyperlipidemia, unspecified: Secondary | ICD-10-CM | POA: Diagnosis not present

## 2017-05-10 NOTE — Progress Notes (Signed)
Daily Session Note  Patient Details  Name: Calvin Gray MRN: [id number] Date of Birth: [birth year] Referring Provider:     Pulmonary Rehab Walk Test from 03/31/2017 in [place]  Referring Provider  Dr. Einar Gip      Encounter Date: 05/10/2017  Check In:     Session Check In - 05/10/17 1156      Check-In   [facility]   Staff Present Su Hilt, MS, ACSM RCEP, Exercise Physiologist;Other;Portia Rollene Rotunda, RN, BSN   Supervising physician immediately available to respond to emergencies [facility] immediately available   Physician(s) Dr. Clementeen Graham   Medication changes reported     No   Fall or balance concerns reported    No   Tobacco Cessation No Change   Warm-up and Cool-down Performed as group-led instruction   Resistance Training Performed Yes   VAD Patient? No     Pain Assessment   Currently in Pain? No/denies   Multiple Pain Sites No      Capillary Blood Glucose: No results found for this or any previous visit (from the past 24 hour(s)).      Exercise Prescription Changes - 05/10/17 1200      Response to Exercise   Blood Pressure (Admit) 122/62   Blood Pressure (Exercise) 138/60   Blood Pressure (Exit) 104/60   Heart Rate (Admit) 70 bpm   Heart Rate (Exercise) 87 bpm   Heart Rate (Exit) 79 bpm   Oxygen Saturation (Admit) 99 %   Oxygen Saturation (Exercise) 97 %   Oxygen Saturation (Exit) 97 %   Rating of Perceived Exertion (Exercise) 13   Perceived Dyspnea (Exercise) 1   Duration Continue with 45 min of aerobic exercise without signs/symptoms of physical distress.   Intensity THRR unchanged     Progression   Progression Continue to progress workloads to maintain intensity without signs/symptoms of physical distress.     Resistance Training   Training Prescription Yes   Weight orange bands   Reps 10-15   Time 10 Minutes     Bike   Level 0.8   Minutes 17     NuStep   Level 5   Minutes  17   METs 2.6     Track   Laps 10   Minutes 17      History  Smoking Status  . Never Smoker  Smokeless Tobacco  . Never Used    Goals Met:  Exercise tolerated well No report of cardiac concerns or symptoms Strength training completed today  Goals Unmet:  Not Applicable  Comments: Service time is from 10:30a to 12:10p    Dr. Rush Farmer is Medical Director for Pulmonary Rehab at [facility].

## 2017-05-11 NOTE — Progress Notes (Signed)
Pulmonary Individual Treatment Plan  Patient Details  Name: Calvin Gray MRN: [id number] Date of Birth: July 16, 1926 Referring Provider:     Pulmonary Rehab Walk Test from 03/31/2017 in [place]  Referring Provider  Dr. Einar Gip      Initial Encounter Date:    Pulmonary Rehab Walk Test from 03/31/2017 in [place]  Date  04/01/17  Referring Provider  Dr. Einar Gip      Visit Diagnosis: Congestive heart failure, unspecified HF chronicity, unspecified heart failure type ([place])  Patient's Home Medications on Admission:   Current Outpatient Prescriptions:  .  alfuzosin (UROXATRAL) 10 MG 24 hr tablet, Take 10 mg by mouth 2 (two) times daily., Disp: , Rfl:  .  aspirin EC 81 MG tablet, Take 81 mg by mouth daily., Disp: , Rfl:  .  lisinopril-hydrochlorothiazide (PRINZIDE,ZESTORETIC) 10-12.5 MG per tablet, Take 1 tablet by mouth daily., Disp: , Rfl:  .  Multiple Vitamin (MULTIVITAMIN) tablet, Take 1 tablet by mouth daily., Disp: , Rfl:  .  pravastatin (PRAVACHOL) 80 MG tablet, Take 80 mg by mouth daily., Disp: , Rfl:  .  temazepam (RESTORIL) 15 MG capsule, Take 15 mg by mouth at bedtime as needed for sleep., Disp: , Rfl:   Past Medical History: Past Medical History:  Diagnosis Date  . Bradycardia, sinus 02/08/12  . Exertional dyspnea   . High cholesterol   . HOH (hard of hearing)   . Hypertension   . Type II diabetes mellitus ([place])    "keeps it controlled w/diet and exercise"    Tobacco Use: History  Smoking Status  . Never Smoker  Smokeless Tobacco  . Never Used    Labs: Recent Review Flowsheet Data    Labs for ITP Cardiac and Pulmonary Rehab Latest Ref Rng & Units 02/08/2012   Hemoglobin A1c <5.7 % 5.7(H)      Capillary Blood Glucose: Lab Results  Component Value Date   GLUCAP 107 (H) 02/09/2012   GLUCAP 98 02/08/2012   GLUCAP 81 02/08/2012   GLUCAP 94 02/08/2012   GLUCAP 96 02/08/2012     Pulmonary  Assessment Scores:     Pulmonary Assessment Scores    Row Name 03/29/17 1142 04/01/17 1015       ADL UCSD   ADL Phase Entry Entry    SOB Score total 5  -      CAT Score   CAT Score 6  Entry  -      mMRC Score   mMRC Score  - 1       Pulmonary Function Assessment:     Pulmonary Function Assessment - 03/25/17 1422      Breath   Bilateral Breath Sounds Clear   Shortness of Breath Yes;Limiting activity      Exercise Target Goals:    Exercise Program Goal: Individual exercise prescription set with THRR, safety & activity barriers. Participant demonstrates ability to understand and report RPE using BORG scale, to self-measure pulse accurately, and to acknowledge the importance of the exercise prescription.  Exercise Prescription Goal: Starting with aerobic activity 30 plus minutes a day, 3 days per week for initial exercise prescription. Provide home exercise prescription and guidelines that participant acknowledges understanding prior to discharge.  Activity Barriers & Risk Stratification:   6 Minute Walk:     6 Minute Walk    Row Name 04/01/17 1015         6 Minute Walk   Phase Initial  Distance 900 feet     Walk Time 6 minutes     # of Rest Breaks 0     MPH 1.7     METS 2.3     RPE 11     Perceived Dyspnea  1     Symptoms No     Resting HR 75 bpm     Resting BP 113/69     Max Ex. HR 95 bpm     Max Ex. BP 145/71       Interval HR   Baseline HR (retired) 75     1 Minute HR 92     2 Minute HR 91     3 Minute HR 91     4 Minute HR 91     5 Minute HR 95     6 Minute HR 93     2 Minute Post HR 84     Interval Heart Rate? Yes       Interval Oxygen   Interval Oxygen? Yes     Baseline Oxygen Saturation % 96 %     Resting Liters of Oxygen 0 L     1 Minute Oxygen Saturation % 96 %     1 Minute Liters of Oxygen 0 L     2 Minute Oxygen Saturation % 96 %     2 Minute Liters of Oxygen 0 L     3 Minute Oxygen Saturation % 96 %     3 Minute Liters  of Oxygen 0 L     4 Minute Oxygen Saturation % 96 %     4 Minute Liters of Oxygen 0 L     5 Minute Oxygen Saturation % 96 %     5 Minute Liters of Oxygen 0 L     6 Minute Oxygen Saturation % 96 %     6 Minute Liters of Oxygen 0 L     2 Minute Post Oxygen Saturation % 96 %     2 Minute Post Liters of Oxygen 0 L        Oxygen Initial Assessment:     Oxygen Initial Assessment - 04/01/17 1014      Initial 6 min Walk   Oxygen Used None   Resting Oxygen Saturation  96 %   Exercise Oxygen Saturation  during 6 min walk 96 %     Program Oxygen Prescription   Program Oxygen Prescription None      Oxygen Re-Evaluation:     Oxygen Re-Evaluation    Row Name 04/12/17 1404 05/11/17 1407           Program Oxygen Prescription   Program Oxygen Prescription None None        Home Oxygen   Home Oxygen Device None None      Sleep Oxygen Prescription CPAP CPAP      Home Exercise Oxygen Prescription None None      Home at Rest Exercise Oxygen Prescription None None         Oxygen Discharge (Final Oxygen Re-Evaluation):     Oxygen Re-Evaluation - 05/11/17 1407      Program Oxygen Prescription   Program Oxygen Prescription None     Home Oxygen   Home Oxygen Device None   Sleep Oxygen Prescription CPAP   Home Exercise Oxygen Prescription None   Home at Rest Exercise Oxygen Prescription None      Initial Exercise Prescription:     Initial Exercise Prescription - 04/01/17 1000  Date of Initial Exercise RX and Referring Provider   Date 04/01/17   Referring Provider Dr. Einar Gip     Bike   Level 0.4   Minutes 17     NuStep   Level 2   Minutes 17   METs 1.5     Track   Laps 6   Minutes 17     Prescription Details   Frequency (times per week) 2   Duration Progress to 45 minutes of aerobic exercise without signs/symptoms of physical distress     Intensity   THRR 40-80% of Max Heartrate 52-105   Ratings of Perceived Exertion 11-13   Perceived Dyspnea 0-4      Progression   Progression Continue progressive overload as per policy without signs/symptoms or physical distress.     Resistance Training   Training Prescription Yes   Weight orange bands   Reps 10-15      Perform Capillary Blood Glucose checks as needed.  Exercise Prescription Changes:     Exercise Prescription Changes    Row Name 04/07/17 1200 04/12/17 1200 04/14/17 1200 04/19/17 1228 04/21/17 1200     Response to Exercise   Blood Pressure (Admit) 120/54 128/4 122/58 118/50 100/44   Blood Pressure (Exercise) 150/64 142/60 134/70 120/60  -   Blood Pressure (Exit) 110/60 108/64 100/58 122/60 108/68   Heart Rate (Admit) 74 bpm 72 bpm 78 bpm 75 bpm 71 bpm   Heart Rate (Exercise) 84 bpm 90 bpm 93 bpm 99 bpm 83 bpm   Heart Rate (Exit) 77 bpm 70 bpm 71 bpm 94 bpm 70 bpm   Oxygen Saturation (Admit) 96 % 97 % 98 % 98 % 97 %   Oxygen Saturation (Exercise) 97 % 97 % 96 % 97 % 97 %   Oxygen Saturation (Exit) 97 % 95 % 96 % 97 % 97 %   Rating of Perceived Exertion (Exercise) 11 11 11 13 11    Perceived Dyspnea (Exercise) 1 1 1 1 1    Duration Continue with 45 min of aerobic exercise without signs/symptoms of physical distress. Continue with 45 min of aerobic exercise without signs/symptoms of physical distress. Continue with 45 min of aerobic exercise without signs/symptoms of physical distress. Continue with 45 min of aerobic exercise without signs/symptoms of physical distress. Continue with 45 min of aerobic exercise without signs/symptoms of physical distress.   Intensity THRR unchanged THRR unchanged THRR unchanged THRR unchanged THRR unchanged     Progression   Progression  - Continue to progress workloads to maintain intensity without signs/symptoms of physical distress. Continue to progress workloads to maintain intensity without signs/symptoms of physical distress. Continue to progress workloads to maintain intensity without signs/symptoms of physical distress. Continue to progress  workloads to maintain intensity without signs/symptoms of physical distress.     Resistance Training   Training Prescription Yes Yes Yes Yes Yes   Weight orange bands orange bands orange bands orange bands orange bands   Reps 10-15 10-15 10-15 10-15 10-15   Time  - 10 Minutes 10 Minutes 10 Minutes 10 Minutes     Bike   Level 0.6 0.5  - 0.6  -   Minutes 17 17  - 17  -     NuStep   Level 2 2 2 3 3    Minutes 17 17 17 17 17    METs 1.6 1.8 1.9 1.9 2     Track   Laps  - 10 11 7 8    Minutes  - 17  17 17 17    Row Name 04/26/17 1200 04/28/17 1200 05/03/17 1200 05/05/17 1248 05/10/17 1200     Response to Exercise   Blood Pressure (Admit) 120/54 110/60 110/64 110/60 122/62   Blood Pressure (Exercise) 110/54 144/76 130/66 130/60 138/60   Blood Pressure (Exit) 108/68 100/60 102/60 108/70 104/60   Heart Rate (Admit) 81 bpm 73 bpm 70 bpm 66 bpm 70 bpm   Heart Rate (Exercise) 108 bpm 92 bpm 106 bpm 86 bpm 87 bpm   Heart Rate (Exit) 60 bpm 75 bpm 79 bpm 72 bpm 79 bpm   Oxygen Saturation (Admit) 97 % 98 % 99 % 98 % 99 %   Oxygen Saturation (Exercise) 96 % 98 % 96 % 97 % 97 %   Oxygen Saturation (Exit) 96 % 96 % 97 % 97 % 97 %   Rating of Perceived Exertion (Exercise) 12 13 11 12 13    Perceived Dyspnea (Exercise) 1 1 3 2 1    Duration Continue with 45 min of aerobic exercise without signs/symptoms of physical distress. Continue with 45 min of aerobic exercise without signs/symptoms of physical distress. Continue with 45 min of aerobic exercise without signs/symptoms of physical distress. Continue with 45 min of aerobic exercise without signs/symptoms of physical distress. Continue with 45 min of aerobic exercise without signs/symptoms of physical distress.   Intensity THRR unchanged THRR unchanged THRR unchanged THRR unchanged THRR unchanged     Progression   Progression Continue to progress workloads to maintain intensity without signs/symptoms of physical distress. Continue to progress workloads  to maintain intensity without signs/symptoms of physical distress. Continue to progress workloads to maintain intensity without signs/symptoms of physical distress. Continue to progress workloads to maintain intensity without signs/symptoms of physical distress. Continue to progress workloads to maintain intensity without signs/symptoms of physical distress.     Resistance Training   Training Prescription Yes Yes Yes Yes Yes   Weight orange bands orange bands orange bands orange bands orange bands   Reps 10-15 10-15 10-15 10-15 10-15   Time 10 Minutes 10 Minutes 10 Minutes 10 Minutes 10 Minutes     Bike   Level 0.8 0.8 0.7  - 0.8   Minutes 17 17 17   - 17     NuStep   Level 4 4 4 4 5    Minutes 17 17 17 17 17    METs 2.6 2.5 2.5 2.3 2.6     Track   Laps 10 12 8 9 10    Minutes 17 17 17 17 17       Exercise Comments:   Exercise Goals and Review:     Exercise Goals    Row Name 03/25/17 1400             Exercise Goals   Increase Physical Activity Yes       Intervention Provide advice, education, support and counseling about physical activity/exercise needs.;Develop an individualized exercise prescription for aerobic and resistive training based on initial evaluation findings, risk stratification, comorbidities and participant's personal goals.       Expected Outcomes Achievement of increased cardiorespiratory fitness and enhanced flexibility, muscular endurance and strength shown through measurements of functional capacity and personal statement of participant.       Increase Strength and Stamina Yes       Intervention Provide advice, education, support and counseling about physical activity/exercise needs.;Develop an individualized exercise prescription for aerobic and resistive training based on initial evaluation findings, risk stratification, comorbidities and participant's personal goals.  Expected Outcomes Achievement of increased cardiorespiratory fitness and enhanced  flexibility, muscular endurance and strength shown through measurements of functional capacity and personal statement of participant.          Exercise Goals Re-Evaluation :     Exercise Goals Re-Evaluation    Row Name 04/08/17 1524 05/10/17 0826           Exercise Goal Re-Evaluation   Exercise Goals Review Increase Physical Activity;Increase Strenth and Stamina Increase Physical Activity;Increase Strength and Stamina;Able to understand and use Dyspnea scale;Able to understand and use rate of perceived exertion (RPE) scale;Knowledge and understanding of Target Heart Rate Range (THRR);Understanding of Exercise Prescription      Comments Patient has only attended one exercise session. Will cont. to monitor and progress as able. Patient is progressing well in program. Low and slow progression. Attendance is consistent. Walking up to 9 laps (200 ft each lap) in 15 minutes.       Expected Outcomes Through the exercise at rehab and at home, the patient will be able to increase physical activity, strength, and stamina.  Through the exercise at rehab and at home, the patient will be able to increase physical activity, strength, and stamina.          Discharge Exercise Prescription (Final Exercise Prescription Changes):     Exercise Prescription Changes - 05/10/17 1200      Response to Exercise   Blood Pressure (Admit) 122/62   Blood Pressure (Exercise) 138/60   Blood Pressure (Exit) 104/60   Heart Rate (Admit) 70 bpm   Heart Rate (Exercise) 87 bpm   Heart Rate (Exit) 79 bpm   Oxygen Saturation (Admit) 99 %   Oxygen Saturation (Exercise) 97 %   Oxygen Saturation (Exit) 97 %   Rating of Perceived Exertion (Exercise) 13   Perceived Dyspnea (Exercise) 1   Duration Continue with 45 min of aerobic exercise without signs/symptoms of physical distress.   Intensity THRR unchanged     Progression   Progression Continue to progress workloads to maintain intensity without signs/symptoms of  physical distress.     Resistance Training   Training Prescription Yes   Weight orange bands   Reps 10-15   Time 10 Minutes     Bike   Level 0.8   Minutes 17     NuStep   Level 5   Minutes 17   METs 2.6     Track   Laps 10   Minutes 17      Nutrition:  Target Goals: Understanding of nutrition guidelines, daily intake of sodium 1500mg , cholesterol 200mg , calories 30% from fat and 7% or less from saturated fats, daily to have 5 or more servings of fruits and vegetables.  Biometrics:    Nutrition Therapy Plan and Nutrition Goals:     Nutrition Therapy & Goals - 05/09/17 1151      Nutrition Therapy   Diet General, Healthful     Intervention Plan   Intervention Prescribe, educate and counsel regarding individualized specific dietary modifications aiming towards targeted core components such as weight, hypertension, lipid management, diabetes, heart failure and other comorbidities.   Expected Outcomes Short Term Goal: Understand basic principles of dietary content, such as calories, fat, sodium, cholesterol and nutrients.;Long Term Goal: Adherence to prescribed nutrition plan.      Nutrition Discharge: Rate Your Plate Scores:     Nutrition Assessments - 05/09/17 1145      Rate Your Plate Scores   Pre Score 56  Nutrition Goals Re-Evaluation:   Nutrition Goals Discharge (Final Nutrition Goals Re-Evaluation):   Psychosocial: Target Goals: Acknowledge presence or absence of significant depression and/or stress, maximize coping skills, provide positive support system. Participant is able to verbalize types and ability to use techniques and skills needed for reducing stress and depression.  Initial Review & Psychosocial Screening:     Initial Psych Review & Screening - 03/25/17 1422      Initial Review   Current issues with None Identified     Family Dynamics   Good Support System? Yes     Barriers   Psychosocial barriers to participate in program  There are no identifiable barriers or psychosocial needs.     Screening Interventions   Interventions Encouraged to exercise      Quality of Life Scores:   PHQ-9: Recent Review Flowsheet Data    Depression screen [facility] 2/9 03/25/2017   Decreased Interest 0   Down, Depressed, Hopeless 0   PHQ - 2 Score 0     Interpretation of Total Score  Total Score Depression Severity:  1-4 = Minimal depression, 5-9 = Mild depression, 10-14 = Moderate depression, 15-19 = Moderately severe depression, 20-27 = Severe depression   Psychosocial Evaluation and Intervention:     Psychosocial Evaluation - 03/25/17 1423      Psychosocial Evaluation & Interventions   Interventions Encouraged to exercise with the program and follow exercise prescription   Continue Psychosocial Services  No Follow up required      Psychosocial Re-Evaluation:     Psychosocial Re-Evaluation    [place] Name 04/12/17 1406 05/11/17 1409           Psychosocial Re-Evaluation   Current issues with None Identified None Identified      Expected Outcomes patient will remain free from psychosocial barriers to participation patient will remain free from psychosocial barriers to participation      Interventions Encouraged to attend Pulmonary Rehabilitation for the exercise Encouraged to attend Pulmonary Rehabilitation for the exercise      Continue Psychosocial Services  No Follow up required No Follow up required         Psychosocial Discharge (Final Psychosocial Re-Evaluation):     Psychosocial Re-Evaluation - 05/11/17 1409      Psychosocial Re-Evaluation   Current issues with None Identified   Expected Outcomes patient will remain free from psychosocial barriers to participation   Interventions Encouraged to attend Pulmonary Rehabilitation for the exercise   Continue Psychosocial Services  No Follow up required      Education: Education Goals: Education classes will be provided on a weekly basis, covering required  topics. Participant will state understanding/return demonstration of topics presented.  Learning Barriers/Preferences:     Learning Barriers/Preferences - 03/25/17 1417      Learning Barriers/Preferences   Learning Barriers None   Learning Preferences Written Material;Group Instruction;Individual Instruction;Skilled Demonstration;Verbal Instruction      Education Topics: Risk Factor Reduction:  -Group instruction that is supported by a PowerPoint presentation. Instructor discusses the definition of a risk factor, different risk factors for pulmonary disease, and how the heart and lungs work together.     PULMONARY REHAB OTHER RESPIRATORY from 05/05/2017 in [place]  Date  04/07/17  Educator  EP  Instruction Review Code  2- meets goals/outcomes      Nutrition for Pulmonary Patient:  -Group instruction provided by PowerPoint slides, verbal discussion, and written materials to support subject matter. The instructor gives an explanation and  review of healthy diet recommendations, which includes a discussion on weight management, recommendations for fruit and vegetable consumption, as well as protein, fluid, caffeine, fiber, sodium, sugar, and alcohol. Tips for eating when patients are short of breath are discussed.   PULMONARY REHAB OTHER RESPIRATORY from 05/05/2017 in [place]  Date  05/05/17  Educator  [employer]  Instruction Review Code  2- meets goals/outcomes      Pursed Lip Breathing:  -Group instruction that is supported by demonstration and informational handouts. Instructor discusses the benefits of pursed lip and diaphragmatic breathing and detailed demonstration on how to preform both.     Oxygen Safety:  -Group instruction provided by PowerPoint, verbal discussion, and written material to support subject matter. There is an overview of "What is Oxygen" and "Why do we need it".  Instructor also reviews how to  create a safe environment for oxygen use, the importance of using oxygen as prescribed, and the risks of noncompliance. There is a brief discussion on traveling with oxygen and resources the patient may utilize.   PULMONARY REHAB OTHER RESPIRATORY from 05/05/2017 in [place]  Date  04/21/17  Educator  Truddie Crumble  Instruction Review Code  2- meets goals/outcomes      Oxygen Equipment:  -Group instruction provided by [employer] Staff utilizing handouts, written materials, and equipment demonstrations.   Signs and Symptoms:  -Group instruction provided by written material and verbal discussion to support subject matter. Warning signs and symptoms of infection, stroke, and heart attack are reviewed and when to call the physician/911 reinforced. Tips for preventing the spread of infection discussed.   Advanced Directives:  -Group instruction provided by verbal instruction and written material to support subject matter. Instructor reviews Advanced Directive laws and proper instruction for filling out document.   Pulmonary Video:  -Group video education that reviews the importance of medication and oxygen compliance, exercise, good nutrition, pulmonary hygiene, and pursed lip and diaphragmatic breathing for the pulmonary patient.   Exercise for the Pulmonary Patient:  -Group instruction that is supported by a PowerPoint presentation. Instructor discusses benefits of exercise, core components of exercise, frequency, duration, and intensity of an exercise routine, importance of utilizing pulse oximetry during exercise, safety while exercising, and options of places to exercise outside of rehab.     Pulmonary Medications:  -Verbally interactive group education provided by instructor with focus on inhaled medications and proper administration.   Anatomy and Physiology of the Respiratory System and Intimacy:  -Group instruction provided by PowerPoint, verbal  discussion, and written material to support subject matter. Instructor reviews respiratory cycle and anatomical components of the respiratory system and their functions. Instructor also reviews differences in obstructive and restrictive respiratory diseases with examples of each. Intimacy, Sex, and Sexuality differences are reviewed with a discussion on how relationships can change when diagnosed with pulmonary disease. Common sexual concerns are reviewed.   MD DAY -A group question and answer session with a medical doctor that allows participants to ask questions that relate to their pulmonary disease state.   OTHER EDUCATION -Group or individual verbal, written, or video instructions that support the educational goals of the pulmonary rehab program.   Knowledge Questionnaire Score:     Knowledge Questionnaire Score - 04/12/17 0856      Knowledge Questionnaire Score   Pre Score 13/13      Core Components/Risk Factors/Patient Goals at Admission:     Personal Goals and Risk Factors at Admission -  03/25/17 1423      Core Components/Risk Factors/Patient Goals on Admission   Improve shortness of breath with ADL's Yes   Intervention Provide education, individualized exercise plan and daily activity instruction to help decrease symptoms of SOB with activities of daily living.   Expected Outcomes Short Term: Achieves a reduction of symptoms when performing activities of daily living.   Heart Failure Yes   Intervention Provide a combined exercise and nutrition program that is supplemented with education, support and counseling about heart failure. Directed toward relieving symptoms such as shortness of breath, decreased exercise tolerance, and extremity edema.   Expected Outcomes Improve functional capacity of life;Short term: Attendance in program 2-3 days a week with increased exercise capacity. Reported lower sodium intake. Reported increased fruit and vegetable intake. Reports medication  compliance.;Short term: Daily weights obtained and reported for increase. Utilizing diuretic protocols set by physician.;Long term: Adoption of self-care skills and reduction of barriers for early signs and symptoms recognition and intervention leading to self-care maintenance.      Core Components/Risk Factors/Patient Goals Review:      Goals and Risk Factor Review    Row Name 04/12/17 1405 05/11/17 1408           Core Components/Risk Factors/Patient Goals Review   Personal Goals Review Improve shortness of breath with ADL's;Heart Failure Improve shortness of breath with ADL's;Heart Failure      Review patient has only attended 2 sessions since admission and it is to soon to evaluate progress towards goals patient is beginning to see an improvement with his shortness of breath. His weight remains constant and does not show signs of fluid overload.       Expected Outcomes see admission expected outcomes see admission expected outcomes         Core Components/Risk Factors/Patient Goals at Discharge (Final Review):      Goals and Risk Factor Review - 05/11/17 1408      Core Components/Risk Factors/Patient Goals Review   Personal Goals Review Improve shortness of breath with ADL's;Heart Failure   Review patient is beginning to see an improvement with his shortness of breath. His weight remains constant and does not show signs of fluid overload.    Expected Outcomes see admission expected outcomes      ITP Comments:   Comments: ITP REVIEW Pt is making expected progress toward pulmonary rehab goals after completing 10 sessions. Recommend continued exercise, life style modification, education, and utilization of breathing techniques to increase stamina and strength and decrease shortness of breath with exertion.

## 2017-05-12 ENCOUNTER — Encounter (HOSPITAL_COMMUNITY)
Admission: RE | Admit: 2017-05-12 | Discharge: 2017-05-12 | Disposition: A | Discharge status: Home / Self Care | Payer: Medicare Other | Source: Ambulatory Visit | Attending: Cardiology | Admitting: Cardiology

## 2017-05-12 VITALS — Wt 166.9 lb

## 2017-05-12 DIAGNOSIS — E119 Type 2 diabetes mellitus without complications: Secondary | ICD-10-CM | POA: Diagnosis not present

## 2017-05-12 DIAGNOSIS — I509 Heart failure, unspecified: Secondary | ICD-10-CM | POA: Diagnosis not present

## 2017-05-12 DIAGNOSIS — Z79899 Other long term (current) drug therapy: Secondary | ICD-10-CM | POA: Diagnosis not present

## 2017-05-12 DIAGNOSIS — E785 Hyperlipidemia, unspecified: Secondary | ICD-10-CM | POA: Diagnosis not present

## 2017-05-12 DIAGNOSIS — Z7982 Long term (current) use of aspirin: Secondary | ICD-10-CM | POA: Diagnosis not present

## 2017-05-12 DIAGNOSIS — I110 Hypertensive heart disease with heart failure: Secondary | ICD-10-CM | POA: Diagnosis not present

## 2017-05-12 NOTE — Progress Notes (Signed)
Calvin Gray 83 y.o. male   DOB: 1926-02-04 MRN: [id number]          Nutrition Note 1. Congestive heart failure, unspecified HF chronicity, unspecified heart failure type ([place])    Spoke with pt and pt's wife. Spoke mostly to pt's wife per pt preference "I have a hard time hearing you and she does most of the cooking." There are some ways the pt can make his eating habits healthier. Age-appropriate nutrition therapy discussed. Pt and pt's wife expressed understanding of the nutrition information reviewed.  Nutrition Diagnosis ? Food-and nutrition-related knowledge deficit related to lack of exposure to information as related to diagnosis of pulmonary disease  Goal(s) 1. Describe the benefit of including fruits, vegetables, whole grains, and low-fat dairy products in a healthy meal plan. Plan:  Pt to attend Pulmonary Nutrition class - met; 05/05/17 Will provide client-centered nutrition education as part of interdisciplinary care.   Monitor and evaluate progress toward nutrition goal with team.  Monitor and Evaluate progress toward nutrition goal with team.   Derek Mound, M.Ed, RD, LDN, CDE 05/12/2017 1:22 PM

## 2017-05-12 NOTE — Progress Notes (Signed)
Daily Session Note  Patient Details  Name: Calvin Gray MRN: [id number] Date of Birth: [birth year] Referring Provider:     Pulmonary Rehab Walk Test from 03/31/2017 in [place]  Referring Provider  Dr. Einar Gip      Encounter Date: 05/12/2017  Check In:   Capillary Blood Glucose: No results found for this or any previous visit (from the past 24 hour(s)).      Exercise Prescription Changes - 05/12/17 1200      Response to Exercise   Blood Pressure (Admit) 100/50   Blood Pressure (Exercise) 122/58   Blood Pressure (Exit) 114/60   Heart Rate (Admit) 72 bpm   Heart Rate (Exercise) 97 bpm   Heart Rate (Exit) 75 bpm   Oxygen Saturation (Admit) 96 %   Oxygen Saturation (Exercise) 98 %   Oxygen Saturation (Exit) 97 %   Rating of Perceived Exertion (Exercise) 13   Perceived Dyspnea (Exercise) 1   Duration Continue with 45 min of aerobic exercise without signs/symptoms of physical distress.   Intensity THRR unchanged     Progression   Progression Continue to progress workloads to maintain intensity without signs/symptoms of physical distress.     Resistance Training   Training Prescription Yes   Weight orangebands   Reps 10-15   Time 10 Minutes     Bike   Level 0.8   Minutes 17     NuStep   Level 5   Minutes 17   METs 2.8      History  Smoking Status  . Never Smoker  Smokeless Tobacco  . Never Used    Goals Met:  Exercise tolerated well No report of cardiac concerns or symptoms Strength training completed today  Goals Unmet:  Not Applicable  Comments: Service time is from 1030 to 1220    Dr. Rush Farmer is Medical Director for Pulmonary Rehab at [facility].

## 2017-05-17 ENCOUNTER — Encounter (HOSPITAL_COMMUNITY)
Admission: RE | Admit: 2017-05-17 | Discharge: 2017-05-17 | Disposition: A | Discharge status: Home / Self Care | Payer: Medicare Other | Source: Ambulatory Visit | Attending: Cardiology | Admitting: Cardiology

## 2017-05-17 VITALS — Wt 163.8 lb

## 2017-05-17 DIAGNOSIS — Z79899 Other long term (current) drug therapy: Secondary | ICD-10-CM | POA: Insufficient documentation

## 2017-05-17 DIAGNOSIS — E119 Type 2 diabetes mellitus without complications: Secondary | ICD-10-CM | POA: Insufficient documentation

## 2017-05-17 DIAGNOSIS — I110 Hypertensive heart disease with heart failure: Secondary | ICD-10-CM | POA: Diagnosis not present

## 2017-05-17 DIAGNOSIS — Z7982 Long term (current) use of aspirin: Secondary | ICD-10-CM | POA: Insufficient documentation

## 2017-05-17 DIAGNOSIS — I509 Heart failure, unspecified: Secondary | ICD-10-CM | POA: Diagnosis not present

## 2017-05-17 DIAGNOSIS — E785 Hyperlipidemia, unspecified: Secondary | ICD-10-CM | POA: Insufficient documentation

## 2017-05-17 DIAGNOSIS — H9190 Unspecified hearing loss, unspecified ear: Secondary | ICD-10-CM | POA: Insufficient documentation

## 2017-05-17 NOTE — Progress Notes (Signed)
Daily Session Note  Patient Details  Name: Calvin Gray MRN: [id number] Date of Birth: June 08, 1926 Referring Provider:     Pulmonary Rehab Walk Test from 03/31/2017 in [place]  Referring Provider  Dr. Einar Gip      Encounter Date: 05/17/2017  Check In:     Session Check In - 05/17/17 1213      Check-In   Location [facility]   Staff Present Su Hilt, MS, ACSM RCEP, Exercise Physiologist;Brigitte Soderberg Ysidro Evert, RN;Other;Portia Rollene Rotunda, RN, BSN   Supervising physician immediately available to respond to emergencies [facility] immediately available   Physician(s) Dr. Bonner Puna   Medication changes reported     No   Fall or balance concerns reported    No   Tobacco Cessation No Change   Warm-up and Cool-down Performed as group-led instruction   Resistance Training Performed Yes   VAD Patient? No     Pain Assessment   Currently in Pain? No/denies   Multiple Pain Sites No      Capillary Blood Glucose: No results found for this or any previous visit (from the past 24 hour(s)).      Exercise Prescription Changes - 05/17/17 1200      Response to Exercise   Blood Pressure (Admit) 112/62   Blood Pressure (Exercise) 142/60   Blood Pressure (Exit) 130/60   Heart Rate (Admit) 74 bpm   Heart Rate (Exercise) 98 bpm   Heart Rate (Exit) 71 bpm   Oxygen Saturation (Admit) 98 %   Oxygen Saturation (Exercise) 95 %   Oxygen Saturation (Exit) 97 %   Rating of Perceived Exertion (Exercise) 13   Perceived Dyspnea (Exercise) 3   Duration Continue with 45 min of aerobic exercise without signs/symptoms of physical distress.   Intensity THRR unchanged     Progression   Progression Continue to progress workloads to maintain intensity without signs/symptoms of physical distress.     Resistance Training   Training Prescription Yes   Weight orange bands   Reps 10-15   Time 10 Minutes     Bike   Level 0.8   Minutes 17     NuStep   Level 5    Minutes 17   METs 1.9     Track   Laps 9   Minutes 17      History  Smoking Status  . Never Smoker  Smokeless Tobacco  . Never Used    Goals Met:  Exercise tolerated well No report of cardiac concerns or symptoms Strength training completed today  Goals Unmet:  Not Applicable  Comments: Service time is from 1030 to 1205    Dr. Rush Farmer is Medical Director for Pulmonary Rehab at [facility].

## 2017-05-17 NOTE — Progress Notes (Signed)
I have reviewed a Home Exercise Prescription with Calvin Gray . Calvin Gray is not currently exercising at home.  The patient was advised to walk 2-3 days a week for 20-30 minutes.  Calvin Gray and I discussed how to progress their exercise prescription.  The patient stated that their goals were to work in the yard more and increase stamina.  The patient stated that they understand the exercise prescription.  We reviewed exercise guidelines, target heart rate during exercise, oxygen use, weather, home pulse oximeter, endpoints for exercise, and goals.  Patient is encouraged to come to me with any questions. I will continue to follow up with the patient to assist them with progression and safety.

## 2017-05-19 ENCOUNTER — Encounter (HOSPITAL_COMMUNITY)
Admission: RE | Admit: 2017-05-19 | Discharge: 2017-05-19 | Disposition: A | Discharge status: Home / Self Care | Payer: Medicare Other | Source: Ambulatory Visit | Attending: Cardiology | Admitting: Cardiology

## 2017-05-19 VITALS — Wt 164.9 lb

## 2017-05-19 DIAGNOSIS — Z7982 Long term (current) use of aspirin: Secondary | ICD-10-CM | POA: Diagnosis not present

## 2017-05-19 DIAGNOSIS — E119 Type 2 diabetes mellitus without complications: Secondary | ICD-10-CM | POA: Diagnosis not present

## 2017-05-19 DIAGNOSIS — Z79899 Other long term (current) drug therapy: Secondary | ICD-10-CM | POA: Diagnosis not present

## 2017-05-19 DIAGNOSIS — I509 Heart failure, unspecified: Secondary | ICD-10-CM

## 2017-05-19 DIAGNOSIS — E785 Hyperlipidemia, unspecified: Secondary | ICD-10-CM | POA: Diagnosis not present

## 2017-05-19 DIAGNOSIS — I110 Hypertensive heart disease with heart failure: Secondary | ICD-10-CM | POA: Diagnosis not present

## 2017-05-19 NOTE — Progress Notes (Signed)
Daily Session Note  Patient Details  Name: Calvin Gray MRN: [id number] Date of Birth: 04-10-1926 Referring Provider:     Pulmonary Rehab Walk Test from 03/31/2017 in [place]  Referring Provider  Dr. Einar Gip      Encounter Date: 05/19/2017  Check In:     Session Check In - 05/19/17 1210      Check-In   Location [facility]   Staff Present Su Hilt, MS, ACSM RCEP, Exercise Physiologist;Lilou Kneip Ysidro Evert, RN;Other;Portia Rollene Rotunda, RN, BSN   Supervising physician immediately available to respond to emergencies [facility] immediately available   Physician(s) Dr. Bonner Puna   Medication changes reported     No   Fall or balance concerns reported    No   Tobacco Cessation No Change   Warm-up and Cool-down Performed as group-led instruction   Resistance Training Performed Yes   VAD Patient? No     Pain Assessment   Currently in Pain? No/denies   Multiple Pain Sites No      Capillary Blood Glucose: No results found for this or any previous visit (from the past 24 hour(s)).      Exercise Prescription Changes - 05/19/17 1200      Response to Exercise   Blood Pressure (Admit) 100/58   Blood Pressure (Exercise) 132/60   Blood Pressure (Exit) 106/60   Heart Rate (Admit) 72 bpm   Heart Rate (Exercise) 91 bpm   Heart Rate (Exit) 77 bpm   Oxygen Saturation (Admit) 97 %   Oxygen Saturation (Exercise) 97 %   Oxygen Saturation (Exit) 97 %   Rating of Perceived Exertion (Exercise) 11   Perceived Dyspnea (Exercise) 1   Duration Continue with 45 min of aerobic exercise without signs/symptoms of physical distress.   Intensity THRR unchanged     Progression   Progression Continue to progress workloads to maintain intensity without signs/symptoms of physical distress.     Resistance Training   Training Prescription Yes   Weight orange bands   Reps 10-15   Time 10 Minutes     NuStep   Level 5   Minutes 17   METs 2.6     Track    Laps 9   Minutes 17      History  Smoking Status  . Never Smoker  Smokeless Tobacco  . Never Used    Goals Met:  Exercise tolerated well No report of cardiac concerns or symptoms Strength training completed today  Goals Unmet:  Not Applicable  Comments: Service time is from 1030 to 1200    Dr. Rush Farmer is Medical Director for Pulmonary Rehab at [facility].

## 2017-05-24 ENCOUNTER — Encounter (HOSPITAL_COMMUNITY)
Admission: RE | Admit: 2017-05-24 | Discharge: 2017-05-24 | Disposition: A | Discharge status: Home / Self Care | Payer: Medicare Other | Source: Ambulatory Visit | Attending: Cardiology | Admitting: Cardiology

## 2017-05-24 VITALS — Wt 167.1 lb

## 2017-05-24 DIAGNOSIS — Z79899 Other long term (current) drug therapy: Secondary | ICD-10-CM | POA: Diagnosis not present

## 2017-05-24 DIAGNOSIS — Z7982 Long term (current) use of aspirin: Secondary | ICD-10-CM | POA: Diagnosis not present

## 2017-05-24 DIAGNOSIS — E785 Hyperlipidemia, unspecified: Secondary | ICD-10-CM | POA: Diagnosis not present

## 2017-05-24 DIAGNOSIS — I509 Heart failure, unspecified: Secondary | ICD-10-CM

## 2017-05-24 DIAGNOSIS — I110 Hypertensive heart disease with heart failure: Secondary | ICD-10-CM | POA: Diagnosis not present

## 2017-05-24 DIAGNOSIS — E119 Type 2 diabetes mellitus without complications: Secondary | ICD-10-CM | POA: Diagnosis not present

## 2017-05-24 NOTE — Progress Notes (Signed)
Daily Session Note  Patient Details  Name: Calvin Gray MRN: [id number] Date of Birth: 27-Apr-1926 Referring Provider:     Pulmonary Rehab Walk Test from 03/31/2017 in [place]  Referring Provider  Dr. Einar Gip      Encounter Date: 05/24/2017  Check In:     Session Check In - 05/24/17 1030      Check-In   [facility]   Staff Present Ramon Dredge, RN, MHA;Portia Rollene Rotunda, RN, BSN;Arnecia Ector, MS, ACSM RCEP, Exercise Physiologist   Supervising physician immediately available to respond to emergencies [facility] immediately available   Physician(s) Dr. Bonner Puna   Medication changes reported     No   Fall or balance concerns reported    No   Tobacco Cessation No Change   Warm-up and Cool-down Performed as group-led instruction   Resistance Training Performed Yes   VAD Patient? No     Pain Assessment   Currently in Pain? No/denies   Multiple Pain Sites No      Capillary Blood Glucose: No results found for this or any previous visit (from the past 24 hour(s)).      Exercise Prescription Changes - 05/24/17 1200      Response to Exercise   Blood Pressure (Admit) 126/54   Blood Pressure (Exercise) 120/64   Blood Pressure (Exit) 122/60   Heart Rate (Admit) 78 bpm   Heart Rate (Exercise) 92 bpm   Heart Rate (Exit) 96 bpm   Oxygen Saturation (Admit) 96 %   Oxygen Saturation (Exercise) 97 %   Oxygen Saturation (Exit) 97 %   Rating of Perceived Exertion (Exercise) 11   Perceived Dyspnea (Exercise) 1   Duration Continue with 45 min of aerobic exercise without signs/symptoms of physical distress.   Intensity THRR unchanged     Progression   Progression Continue to progress workloads to maintain intensity without signs/symptoms of physical distress.     Resistance Training   Training Prescription Yes   Weight orange bands   Reps 10-15   Time 10 Minutes     Bike   Level 0.8   Minutes 17     Track   Laps 8   Minutes 17      History  Smoking Status  . Never Smoker  Smokeless Tobacco  . Never Used    Goals Met:  Exercise tolerated well No report of cardiac concerns or symptoms Strength training completed today  Goals Unmet:  Not Applicable  Comments: Service time is from 10:30a to 12:30p    Dr. Rush Farmer is Medical Director for Pulmonary Rehab at [facility].

## 2017-05-25 DIAGNOSIS — R972 Elevated prostate specific antigen [PSA]: Secondary | ICD-10-CM | POA: Diagnosis not present

## 2017-05-26 ENCOUNTER — Encounter (HOSPITAL_COMMUNITY)
Admission: RE | Admit: 2017-05-26 | Discharge: 2017-05-26 | Disposition: A | Discharge status: Home / Self Care | Payer: Medicare Other | Source: Ambulatory Visit | Attending: Cardiology | Admitting: Cardiology

## 2017-05-26 VITALS — Wt 169.1 lb

## 2017-05-26 DIAGNOSIS — I509 Heart failure, unspecified: Secondary | ICD-10-CM | POA: Diagnosis not present

## 2017-05-26 DIAGNOSIS — E119 Type 2 diabetes mellitus without complications: Secondary | ICD-10-CM | POA: Diagnosis not present

## 2017-05-26 DIAGNOSIS — I110 Hypertensive heart disease with heart failure: Secondary | ICD-10-CM | POA: Diagnosis not present

## 2017-05-26 DIAGNOSIS — E785 Hyperlipidemia, unspecified: Secondary | ICD-10-CM | POA: Diagnosis not present

## 2017-05-26 DIAGNOSIS — Z79899 Other long term (current) drug therapy: Secondary | ICD-10-CM | POA: Diagnosis not present

## 2017-05-26 DIAGNOSIS — Z7982 Long term (current) use of aspirin: Secondary | ICD-10-CM | POA: Diagnosis not present

## 2017-05-26 NOTE — Progress Notes (Signed)
Daily Session Note  Patient Details  Name: Calvin Gray MRN: [id number] Date of Birth: Apr 22, 1926 Referring Provider:     Pulmonary Rehab Walk Test from 03/31/2017 in [place]  Referring Provider  Dr. Einar Gip      Encounter Date: 05/26/2017  Check In:     Session Check In - 05/26/17 1209      Check-In   Location [facility]   Staff Present Su Hilt, MS, ACSM RCEP, Exercise Physiologist;Portia Rollene Rotunda, RN, BSN   Supervising physician immediately available to respond to emergencies [facility] immediately available   Physician(s) Dr. Felicity Coyer Blood Glucose: No results found for this or any previous visit (from the past 24 hour(s)).      Exercise Prescription Changes - 05/26/17 1200      Response to Exercise   Blood Pressure (Admit) 130/60   Blood Pressure (Exercise) 116/64   Blood Pressure (Exit) 112/66   Heart Rate (Admit) 69 bpm   Heart Rate (Exercise) 98 bpm   Heart Rate (Exit) 86 bpm   Oxygen Saturation (Admit) 97 %   Oxygen Saturation (Exercise) 98 %   Oxygen Saturation (Exit) 97 %   Rating of Perceived Exertion (Exercise) 12   Perceived Dyspnea (Exercise) 1   Duration Continue with 45 min of aerobic exercise without signs/symptoms of physical distress.   Intensity THRR unchanged     Progression   Progression Continue to progress workloads to maintain intensity without signs/symptoms of physical distress.     Resistance Training   Training Prescription Yes   Weight orange bands   Reps 10-15   Time 10 Minutes     Bike   Level 0.8   Minutes 17     NuStep   Level 5   Minutes 17   METs 2.3     Track   Laps 11   Minutes 17      History  Smoking Status  . Never Smoker  Smokeless Tobacco  . Never Used    Goals Met:  Exercise tolerated well No report of cardiac concerns or symptoms Strength training completed today  Goals Unmet:  Not Applicable  Comments: Service  time is from 10:30a to 12:00p    Dr. Rush Farmer is Medical Director for Pulmonary Rehab at [facility].

## 2017-05-31 ENCOUNTER — Encounter (HOSPITAL_COMMUNITY)
Admission: RE | Admit: 2017-05-31 | Discharge: 2017-05-31 | Disposition: A | Discharge status: Home / Self Care | Payer: Medicare Other | Source: Ambulatory Visit | Attending: Cardiology | Admitting: Cardiology

## 2017-05-31 VITALS — Wt 168.9 lb

## 2017-05-31 DIAGNOSIS — Z79899 Other long term (current) drug therapy: Secondary | ICD-10-CM | POA: Diagnosis not present

## 2017-05-31 DIAGNOSIS — E119 Type 2 diabetes mellitus without complications: Secondary | ICD-10-CM | POA: Diagnosis not present

## 2017-05-31 DIAGNOSIS — I110 Hypertensive heart disease with heart failure: Secondary | ICD-10-CM | POA: Diagnosis not present

## 2017-05-31 DIAGNOSIS — Z7982 Long term (current) use of aspirin: Secondary | ICD-10-CM | POA: Diagnosis not present

## 2017-05-31 DIAGNOSIS — I509 Heart failure, unspecified: Secondary | ICD-10-CM

## 2017-05-31 DIAGNOSIS — E785 Hyperlipidemia, unspecified: Secondary | ICD-10-CM | POA: Diagnosis not present

## 2017-05-31 NOTE — Progress Notes (Signed)
Daily Session Note  Patient Details  Name: Calvin Gray MRN: [id number] Date of Birth: Dec 21, 1925 Referring Provider:     Pulmonary Rehab Walk Test from 03/31/2017 in [place]  Referring Provider  Dr. Einar Gip      Encounter Date: 05/31/2017  Check In:     Session Check In - 05/31/17 1030      Check-In   Location [facility]   Staff Present Su Hilt, MS, ACSM RCEP, Exercise Physiologist;Precious Gilchrest Rollene Rotunda, RN, Roque Cash, RN   Supervising physician immediately available to respond to emergencies [facility] immediately available   Physician(s) Dr. Carolin Sicks   Medication changes reported     No   Fall or balance concerns reported    No   Tobacco Cessation No Change   Warm-up and Cool-down Performed as group-led instruction   Resistance Training Performed Yes   VAD Patient? No     Pain Assessment   Currently in Pain? No/denies   Multiple Pain Sites No      Capillary Blood Glucose: No results found for this or any previous visit (from the past 24 hour(s)).      Exercise Prescription Changes - 05/31/17 1625      Response to Exercise   Blood Pressure (Admit) 108/62   Blood Pressure (Exercise) 146/54   Blood Pressure (Exit) 122/68   Heart Rate (Admit) 79 bpm   Heart Rate (Exercise) 102 bpm   Heart Rate (Exit) 71 bpm   Oxygen Saturation (Admit) 98 %   Oxygen Saturation (Exercise) 96 %   Oxygen Saturation (Exit) 96 %   Rating of Perceived Exertion (Exercise) 11   Perceived Dyspnea (Exercise) 1   Duration Continue with 45 min of aerobic exercise without signs/symptoms of physical distress.   Intensity THRR unchanged     Progression   Progression Continue to progress workloads to maintain intensity without signs/symptoms of physical distress.     Resistance Training   Training Prescription Yes   Weight orange bands   Reps 10-15   Time 10 Minutes     Bike   Level 0.8   Minutes 17     NuStep   Level 5    Minutes 17   METs 2.7     Track   Laps 10   Minutes 17      History  Smoking Status  . Never Smoker  Smokeless Tobacco  . Never Used    Goals Met:  Independence with exercise equipment Improved SOB with ADL's Using PLB without cueing & demonstrates good technique Exercise tolerated well No report of cardiac concerns or symptoms Strength training completed today  Goals Unmet:  Not Applicable  Comments: Service time is from 1030 to 1200   Dr. Rush Farmer is Medical Director for Pulmonary Rehab at [facility]

## 2017-06-02 ENCOUNTER — Encounter (HOSPITAL_COMMUNITY)
Admission: RE | Admit: 2017-06-02 | Discharge: 2017-06-02 | Disposition: A | Discharge status: Home / Self Care | Payer: Medicare Other | Source: Ambulatory Visit | Attending: Cardiology | Admitting: Cardiology

## 2017-06-02 VITALS — Wt 167.8 lb

## 2017-06-02 DIAGNOSIS — I509 Heart failure, unspecified: Secondary | ICD-10-CM | POA: Diagnosis not present

## 2017-06-02 DIAGNOSIS — I110 Hypertensive heart disease with heart failure: Secondary | ICD-10-CM | POA: Diagnosis not present

## 2017-06-02 DIAGNOSIS — Z79899 Other long term (current) drug therapy: Secondary | ICD-10-CM | POA: Diagnosis not present

## 2017-06-02 DIAGNOSIS — Z7982 Long term (current) use of aspirin: Secondary | ICD-10-CM | POA: Diagnosis not present

## 2017-06-02 DIAGNOSIS — E119 Type 2 diabetes mellitus without complications: Secondary | ICD-10-CM | POA: Diagnosis not present

## 2017-06-02 DIAGNOSIS — E785 Hyperlipidemia, unspecified: Secondary | ICD-10-CM | POA: Diagnosis not present

## 2017-06-02 NOTE — Progress Notes (Signed)
Daily Session Note  Patient Details  Name: Calvin Gray MRN: [id number] Date of Birth: 1926/04/24 Referring Provider:     Pulmonary Rehab Walk Test from 03/31/2017 in [place]  Referring Provider  Dr. Einar Gip      Encounter Date: 06/02/2017  Check In:     Session Check In - 06/02/17 1053      Check-In   Location [facility]   Staff Present Su Hilt, MS, ACSM RCEP, Exercise Physiologist;Portia Rollene Rotunda, RN, BSN   Supervising physician immediately available to respond to emergencies [facility] immediately available   Physician(s) Dr. Wynelle Cleveland   Medication changes reported     No   Fall or balance concerns reported    No   Tobacco Cessation No Change   Warm-up and Cool-down Performed as group-led instruction   Resistance Training Performed Yes   VAD Patient? No     Pain Assessment   Currently in Pain? No/denies   Multiple Pain Sites No      Capillary Blood Glucose: No results found for this or any previous visit (from the past 24 hour(s)).      Exercise Prescription Changes - 06/02/17 1200      Response to Exercise   Blood Pressure (Admit) 116/70   Blood Pressure (Exercise) 128/64   Blood Pressure (Exit) 116/64   Heart Rate (Admit) 70 bpm   Heart Rate (Exercise) 94 bpm   Heart Rate (Exit) 78 bpm   Oxygen Saturation (Admit) 98 %   Oxygen Saturation (Exercise) 97 %   Oxygen Saturation (Exit) 98 %   Rating of Perceived Exertion (Exercise) 11   Perceived Dyspnea (Exercise) 1   Duration Continue with 45 min of aerobic exercise without signs/symptoms of physical distress.   Intensity THRR unchanged     Progression   Progression Continue to progress workloads to maintain intensity without signs/symptoms of physical distress.     Resistance Training   Training Prescription Yes   Weight orange bands   Reps 10-15   Time 10 Minutes     Bike   Level 0.8   Minutes 17     Track   Laps 11   Minutes 17       History  Smoking Status  . Never Smoker  Smokeless Tobacco  . Never Used    Goals Met:  Exercise tolerated well No report of cardiac concerns or symptoms Strength training completed today  Goals Unmet:  Not Applicable  Comments: Service time is from 10:30a to 12:20p    Dr. Rush Farmer is Medical Director for Pulmonary Rehab at [facility].

## 2017-06-03 NOTE — Progress Notes (Signed)
Pulmonary Individual Treatment Plan  Patient Details  Name: Calvin Gray MRN: [id number] Date of Birth: 1926-05-06 Referring Provider:     Pulmonary Rehab Walk Test from 03/31/2017 in [place]  Referring Provider  Dr. Einar Gip      Initial Encounter Date:    Pulmonary Rehab Walk Test from 03/31/2017 in [place]  Date  04/01/17  Referring Provider  Dr. Einar Gip      Visit Diagnosis: Congestive heart failure, unspecified HF chronicity, unspecified heart failure type ([place])  Patient's Home Medications on Admission:   Current Outpatient Prescriptions:  .  alfuzosin (UROXATRAL) 10 MG 24 hr tablet, Take 10 mg by mouth 2 (two) times daily., Disp: , Rfl:  .  aspirin EC 81 MG tablet, Take 81 mg by mouth daily., Disp: , Rfl:  .  lisinopril-hydrochlorothiazide (PRINZIDE,ZESTORETIC) 10-12.5 MG per tablet, Take 1 tablet by mouth daily., Disp: , Rfl:  .  Multiple Vitamin (MULTIVITAMIN) tablet, Take 1 tablet by mouth daily., Disp: , Rfl:  .  pravastatin (PRAVACHOL) 80 MG tablet, Take 80 mg by mouth daily., Disp: , Rfl:  .  temazepam (RESTORIL) 15 MG capsule, Take 15 mg by mouth at bedtime as needed for sleep., Disp: , Rfl:   Past Medical History: Past Medical History:  Diagnosis Date  . Bradycardia, sinus 02/08/12  . Exertional dyspnea   . High cholesterol   . HOH (hard of hearing)   . Hypertension   . Type II diabetes mellitus ([place])    "keeps it controlled w/diet and exercise"    Tobacco Use: History  Smoking Status  . Never Smoker  Smokeless Tobacco  . Never Used    Labs: Recent Review Flowsheet Data    Labs for ITP Cardiac and Pulmonary Rehab Latest Ref Rng & Units 02/08/2012   Hemoglobin A1c <5.7 % 5.7(H)      Capillary Blood Glucose: Lab Results  Component Value Date   GLUCAP 107 (H) 02/09/2012   GLUCAP 98 02/08/2012   GLUCAP 81 02/08/2012   GLUCAP 94 02/08/2012   GLUCAP 96 02/08/2012     Pulmonary  Assessment Scores:     Pulmonary Assessment Scores    Row Name 04/01/17 1015         ADL UCSD   ADL Phase Entry       mMRC Score   mMRC Score 1        Pulmonary Function Assessment:     Pulmonary Function Assessment - 03/25/17 1422      Breath   Bilateral Breath Sounds Clear   Shortness of Breath Yes;Limiting activity      Exercise Target Goals:    Exercise Program Goal: Individual exercise prescription set with THRR, safety & activity barriers. Participant demonstrates ability to understand and report RPE using BORG scale, to self-measure pulse accurately, and to acknowledge the importance of the exercise prescription.  Exercise Prescription Goal: Starting with aerobic activity 30 plus minutes a day, 3 days per week for initial exercise prescription. Provide home exercise prescription and guidelines that participant acknowledges understanding prior to discharge.  Activity Barriers & Risk Stratification:   6 Minute Walk:     6 Minute Walk    Row Name 04/01/17 1015         6 Minute Walk   Phase Initial     Distance 900 feet     Walk Time 6 minutes     # of Rest Breaks 0  MPH 1.7     METS 2.3     RPE 11     Perceived Dyspnea  1     Symptoms No     Resting HR 75 bpm     Resting BP 113/69     Max Ex. HR 95 bpm     Max Ex. BP 145/71       Interval HR   Baseline HR (retired) 75     1 Minute HR 92     2 Minute HR 91     3 Minute HR 91     4 Minute HR 91     5 Minute HR 95     6 Minute HR 93     2 Minute Post HR 84     Interval Heart Rate? Yes       Interval Oxygen   Interval Oxygen? Yes     Baseline Oxygen Saturation % 96 %     Resting Liters of Oxygen 0 L     1 Minute Oxygen Saturation % 96 %     1 Minute Liters of Oxygen 0 L     2 Minute Oxygen Saturation % 96 %     2 Minute Liters of Oxygen 0 L     3 Minute Oxygen Saturation % 96 %     3 Minute Liters of Oxygen 0 L     4 Minute Oxygen Saturation % 96 %     4 Minute Liters of Oxygen 0  L     5 Minute Oxygen Saturation % 96 %     5 Minute Liters of Oxygen 0 L     6 Minute Oxygen Saturation % 96 %     6 Minute Liters of Oxygen 0 L     2 Minute Post Oxygen Saturation % 96 %     2 Minute Post Liters of Oxygen 0 L        Oxygen Initial Assessment:     Oxygen Initial Assessment - 04/01/17 1014      Initial 6 min Walk   Oxygen Used None   Resting Oxygen Saturation  96 %   Exercise Oxygen Saturation  during 6 min walk 96 %     Program Oxygen Prescription   Program Oxygen Prescription None      Oxygen Re-Evaluation:     Oxygen Re-Evaluation    Row Name 04/12/17 1404 05/11/17 1407 06/03/17 0954         Program Oxygen Prescription   Program Oxygen Prescription None None None       Home Oxygen   Home Oxygen Device None None None     Sleep Oxygen Prescription CPAP CPAP CPAP     Home Exercise Oxygen Prescription None None None     Home at Rest Exercise Oxygen Prescription None None None        Oxygen Discharge (Final Oxygen Re-Evaluation):     Oxygen Re-Evaluation - 06/03/17 0954      Program Oxygen Prescription   Program Oxygen Prescription None     Home Oxygen   Home Oxygen Device None   Sleep Oxygen Prescription CPAP   Home Exercise Oxygen Prescription None   Home at Rest Exercise Oxygen Prescription None      Initial Exercise Prescription:     Initial Exercise Prescription - 04/01/17 1000      Date of Initial Exercise RX and Referring Provider   Date 04/01/17   Referring Provider Dr. Einar Gip  Bike   Level 0.4   Minutes 17     NuStep   Level 2   Minutes 17   METs 1.5     Track   Laps 6   Minutes 17     Prescription Details   Frequency (times per week) 2   Duration Progress to 45 minutes of aerobic exercise without signs/symptoms of physical distress     Intensity   THRR 40-80% of Max Heartrate 52-105   Ratings of Perceived Exertion 11-13   Perceived Dyspnea 0-4     Progression   Progression Continue progressive  overload as per policy without signs/symptoms or physical distress.     Resistance Training   Training Prescription Yes   Weight orange bands   Reps 10-15      Perform Capillary Blood Glucose checks as needed.  Exercise Prescription Changes:     Exercise Prescription Changes    Row Name 04/07/17 1200 04/12/17 1200 04/14/17 1200 04/19/17 1228 04/21/17 1200     Response to Exercise   Blood Pressure (Admit) 120/54 128/4 122/58 118/50 100/44   Blood Pressure (Exercise) 150/64 142/60 134/70 120/60  -   Blood Pressure (Exit) 110/60 108/64 100/58 122/60 108/68   Heart Rate (Admit) 74 bpm 72 bpm 78 bpm 75 bpm 71 bpm   Heart Rate (Exercise) 84 bpm 90 bpm 93 bpm 99 bpm 83 bpm   Heart Rate (Exit) 77 bpm 70 bpm 71 bpm 94 bpm 70 bpm   Oxygen Saturation (Admit) 96 % 97 % 98 % 98 % 97 %   Oxygen Saturation (Exercise) 97 % 97 % 96 % 97 % 97 %   Oxygen Saturation (Exit) 97 % 95 % 96 % 97 % 97 %   Rating of Perceived Exertion (Exercise) _0 Perceived Dyspnea (Exercise) _1 Duration Continue with 45 min of aerobic exercise without signs/symptoms of physical distress. Continue with 45 min of aerobic exercise without signs/symptoms of physical distress. Continue with 45 min of aerobic exercise without signs/symptoms of physical distress. Continue with 45 min of aerobic exercise without signs/symptoms of physical distress. Continue with 45 min of aerobic exercise without signs/symptoms of physical distress.   Intensity _2      Progression   Progression  - Continue to progress workloads to maintain intensity without signs/symptoms of physical distress. Continue to progress workloads to maintain intensity without signs/symptoms of physical distress. Continue to progress workloads to maintain intensity without signs/symptoms of physical distress. Continue to progress workloads to maintain intensity without  signs/symptoms of physical distress.     Resistance Training   Training Prescription _3    Weight _4    Reps 10-15 10-15 10-15 10-15 10-15   Time  - 10 Minutes 10 Minutes 10 Minutes 10 Minutes     Bike   Level 0.6 0.5  - 0.6  -   Minutes 17 17  - 17  -     NuStep   Level _5 Minutes _6 METs 1.6 1.8 1.9 1.9 2     Track   Laps  - _7 Minutes  - _8 Row Name 04/26/17 1200 04/28/17 1200 05/03/17 1200 05/05/17 1248 05/10/17 1200     Response  to Exercise   Blood Pressure (Admit) 120/54 110/60 110/64 110/60 122/62   Blood Pressure (Exercise) 110/54 144/76 130/66 130/60 138/60   Blood Pressure (Exit) 108/68 100/60 102/60 108/70 104/60   Heart Rate (Admit) 81 bpm 73 bpm 70 bpm 66 bpm 70 bpm   Heart Rate (Exercise) 108 bpm 92 bpm 106 bpm 86 bpm 87 bpm   Heart Rate (Exit) 60 bpm 75 bpm 79 bpm 72 bpm 79 bpm   Oxygen Saturation (Admit) 97 % 98 % 99 % 98 % 99 %   Oxygen Saturation (Exercise) 96 % 98 % 96 % 97 % 97 %   Oxygen Saturation (Exit) 96 % 96 % 97 % 97 % 97 %   Rating of Perceived Exertion (Exercise) _0 Perceived Dyspnea (Exercise) _1 Duration Continue with 45 min of aerobic exercise without signs/symptoms of physical distress. Continue with 45 min of aerobic exercise without signs/symptoms of physical distress. Continue with 45 min of aerobic exercise without signs/symptoms of physical distress. Continue with 45 min of aerobic exercise without signs/symptoms of physical distress. Continue with 45 min of aerobic exercise without signs/symptoms of physical distress.   Intensity _2      Progression   Progression Continue to progress workloads to maintain intensity without signs/symptoms of physical distress. Continue to progress workloads to maintain intensity without  signs/symptoms of physical distress. Continue to progress workloads to maintain intensity without signs/symptoms of physical distress. Continue to progress workloads to maintain intensity without signs/symptoms of physical distress. Continue to progress workloads to maintain intensity without signs/symptoms of physical distress.     Resistance Training   Training Prescription _3    Weight _4    Reps 10-15 10-15 10-15 10-15 10-15   Time 10 Minutes 10 Minutes 10 Minutes 10 Minutes 10 Minutes     Bike   Level 0.8 0.8 0.7  - 0.8   Minutes _5 - 17     NuStep   Level _6 Minutes _7 METs 2.6 2.5 2.5 2.3 2.6     Track   Laps _8 Minutes _9 Row Name 05/12/17 1200 05/17/17 1200 05/19/17 1200 05/24/17 1200 05/26/17 1200     Response to Exercise   Blood Pressure (Admit) 100/50 112/62 100/58 126/54 130/60   Blood Pressure (Exercise) 122/58 142/60 132/60 120/64 116/64   Blood Pressure (Exit) 114/60 130/60 106/60 122/60 112/66   Heart Rate (Admit) 72 bpm 74 bpm 72 bpm 78 bpm 69 bpm   Heart Rate (Exercise) 97 bpm 98 bpm 91 bpm 92 bpm 98 bpm   Heart Rate (Exit) 75 bpm 71 bpm 77 bpm 96 bpm 86 bpm   Oxygen Saturation (Admit) 96 % 98 % 97 % 96 % 97 %   Oxygen Saturation (Exercise) 98 % 95 % 97 % 97 % 98 %   Oxygen Saturation (Exit) 97 % 97 % 97 % 97 % 97 %   Rating of Perceived Exertion (Exercise) _10 Perceived Dyspnea (Exercise) _11 Duration Continue with 45 min of aerobic exercise without signs/symptoms of physical distress. Continue with 45 min of aerobic exercise without signs/symptoms of physical  distress. Continue with 45 min of aerobic exercise without signs/symptoms of physical distress. Continue with 45 min of aerobic exercise without signs/symptoms of physical distress. Continue with 45 min of aerobic exercise without signs/symptoms of  physical distress.   Intensity _0      Progression   Progression Continue to progress workloads to maintain intensity without signs/symptoms of physical distress. Continue to progress workloads to maintain intensity without signs/symptoms of physical distress. Continue to progress workloads to maintain intensity without signs/symptoms of physical distress. Continue to progress workloads to maintain intensity without signs/symptoms of physical distress. Continue to progress workloads to maintain intensity without signs/symptoms of physical distress.     Resistance Training   Training Prescription _1    Weight orangebands orange bands orange bands orange bands orange bands   Reps 10-15 10-15 10-15 10-15 10-15   Time 10 Minutes 10 Minutes 10 Minutes 10 Minutes 10 Minutes     Bike   Level 0.8 0.8  - 0.8 0.8   Minutes 17 17  - 17 17     NuStep   Level _2 - 5   Minutes _3 - 17   METs 2.8 1.9 2.6  - 2.3     Track   Laps  - _4 Minutes  - _5 Home Exercise Plan   Plans to continue exercise at  - Home (comment)  -  -  -   Frequency  - Add 3 additional days to program exercise sessions.  -  -  -   Row Name 05/31/17 1625 06/02/17 1200           Response to Exercise   Blood Pressure (Admit) 108/62 116/70      Blood Pressure (Exercise) 146/54 128/64      Blood Pressure (Exit) 122/68 116/64      Heart Rate (Admit) 79 bpm 70 bpm      Heart Rate (Exercise) 102 bpm 94 bpm      Heart Rate (Exit) 71 bpm 78 bpm      Oxygen Saturation (Admit) 98 % 98 %      Oxygen Saturation (Exercise) 96 % 97 %      Oxygen Saturation (Exit) 96 % 98 %      Rating of Perceived Exertion (Exercise) 11 11      Perceived Dyspnea (Exercise) 1 1      Duration Continue with 45 min of aerobic exercise without signs/symptoms of physical distress. Continue with 45 min of aerobic exercise without  signs/symptoms of physical distress.      Intensity THRR unchanged THRR unchanged        Progression   Progression Continue to progress workloads to maintain intensity without signs/symptoms of physical distress. Continue to progress workloads to maintain intensity without signs/symptoms of physical distress.        Resistance Training   Training Prescription Yes Yes      Weight orange bands orange bands      Reps 10-15 10-15      Time 10 Minutes 10 Minutes        Bike   Level 0.8 0.8      Minutes 17 17        NuStep   Level 5  -      Minutes 17  -      METs 2.7  -  Track   Laps 10 11      Minutes 17 17         Exercise Comments:     Exercise Comments    Row Name 05/17/17 1258           Exercise Comments Home exercise completed          Exercise Goals and Review:     Exercise Goals    Row Name 03/25/17 1400             Exercise Goals   Increase Physical Activity Yes       Intervention Provide advice, education, support and counseling about physical activity/exercise needs.;Develop an individualized exercise prescription for aerobic and resistive training based on initial evaluation findings, risk stratification, comorbidities and participant's personal goals.       Expected Outcomes Achievement of increased cardiorespiratory fitness and enhanced flexibility, muscular endurance and strength shown through measurements of functional capacity and personal statement of participant.       Increase Strength and Stamina Yes       Intervention Provide advice, education, support and counseling about physical activity/exercise needs.;Develop an individualized exercise prescription for aerobic and resistive training based on initial evaluation findings, risk stratification, comorbidities and participant's personal goals.       Expected Outcomes Achievement of increased cardiorespiratory fitness and enhanced flexibility, muscular endurance and strength shown through  measurements of functional capacity and personal statement of participant.          Exercise Goals Re-Evaluation :     Exercise Goals Re-Evaluation    Row Name 04/08/17 1524 05/10/17 0826 05/30/17 1202         Exercise Goal Re-Evaluation   Exercise Goals Review Increase Physical Activity;Increase Strenth and Stamina Increase Physical Activity;Increase Strength and Stamina;Able to understand and use Dyspnea scale;Able to understand and use rate of perceived exertion (RPE) scale;Knowledge and understanding of Target Heart Rate Range (THRR);Understanding of Exercise Prescription Increase Physical Activity;Increase Strength and Stamina;Able to understand and use Dyspnea scale;Able to understand and use rate of perceived exertion (RPE) scale;Knowledge and understanding of Target Heart Rate Range (THRR);Understanding of Exercise Prescription     Comments Patient has only attended one exercise session. Will cont. to monitor and progress as able. Patient is progressing well in program. Low and slow progression. Attendance is consistent. Walking up to 9 laps (200 ft each lap) in 15 minutes.  Patient is progressing well in program. Attendance is consistent. MET levels average about 2.3-2.6. States that workloads are fairly light to somewhat hard. Will cont to monitor and progress as able.     Expected Outcomes Through the exercise at rehab and at home, the patient will be able to increase physical activity, strength, and stamina.  Through the exercise at rehab and at home, the patient will be able to increase physical activity, strength, and stamina.  Through exercise and education at rehab and at home, patient will increase strength and stamina. Patient will also gain a better understanding of the need for physical activity on a daily basis and the effects it can have on quality of life.        Discharge Exercise Prescription (Final Exercise Prescription Changes):     Exercise Prescription Changes -  06/02/17 1200      Response to Exercise   Blood Pressure (Admit) 116/70   Blood Pressure (Exercise) 128/64   Blood Pressure (Exit) 116/64   Heart Rate (Admit) 70 bpm   Heart Rate (Exercise) 94 bpm  Heart Rate (Exit) 78 bpm   Oxygen Saturation (Admit) 98 %   Oxygen Saturation (Exercise) 97 %   Oxygen Saturation (Exit) 98 %   Rating of Perceived Exertion (Exercise) 11   Perceived Dyspnea (Exercise) 1   Duration Continue with 45 min of aerobic exercise without signs/symptoms of physical distress.   Intensity THRR unchanged     Progression   Progression Continue to progress workloads to maintain intensity without signs/symptoms of physical distress.     Resistance Training   Training Prescription Yes   Weight orange bands   Reps 10-15   Time 10 Minutes     Bike   Level 0.8   Minutes 17     Track   Laps 11   Minutes 17      Nutrition:  Target Goals: Understanding of nutrition guidelines, daily intake of sodium <1549m, cholesterol <2041m calories 30% from fat and 7% or less from saturated fats, daily to have 5 or more servings of fruits and vegetables.  Biometrics:    Nutrition Therapy Plan and Nutrition Goals:     Nutrition Therapy & Goals - 05/12/17 1325      Nutrition Therapy   Diet General, Healthful     Personal Nutrition Goals   Nutrition Goal Describe the benefit of including fruits, vegetables, whole grains, and low-fat dairy products in a healthy meal plan.     Intervention Plan   Expected Outcomes Short Term Goal: Understand basic principles of dietary content, such as calories, fat, sodium, cholesterol and nutrients.;Long Term Goal: Adherence to prescribed nutrition plan.      Nutrition Discharge: Rate Your Plate Scores:     Nutrition Assessments - 05/09/17 1145      Rate Your Plate Scores   Pre Score 56      Nutrition Goals Re-Evaluation:   Nutrition Goals Discharge (Final Nutrition Goals Re-Evaluation):   Psychosocial: Target  Goals: Acknowledge presence or absence of significant depression and/or stress, maximize coping skills, provide positive support system. Participant is able to verbalize types and ability to use techniques and skills needed for reducing stress and depression.  Initial Review & Psychosocial Screening:     Initial Psych Review & Screening - 03/25/17 1422      Initial Review   Current issues with None Identified     Family Dynamics   Good Support System? Yes     Barriers   Psychosocial barriers to participate in program There are no identifiable barriers or psychosocial needs.     Screening Interventions   Interventions Encouraged to exercise      Quality of Life Scores:   PHQ-9: Recent Review Flowsheet Data    [facility]   Decreased Interest 0   Down, Depressed, Hopeless 0   PHQ - 2 Score 0     Interpretation of Total Score  Total Score Depression Severity:  1-4 = Minimal depression, 5-9 = Mild depression, 10-14 = Moderate depression, 15-19 = Moderately severe depression, 20-27 = Severe depression   Psychosocial Evaluation and Intervention:     Psychosocial Evaluation - 03/25/17 1423      Psychosocial Evaluation & Interventions   Interventions Encouraged to exercise with the program and follow exercise prescription   Continue Psychosocial Services  No Follow up required      Psychosocial Re-Evaluation:     Psychosocial Re-Evaluation    RoHalseyame 04/12/17 1406 05/11/17 1409 06/03/17 0956         Psychosocial Re-Evaluation   Current  issues with None Identified None Identified None Identified     Expected Outcomes patient will remain free from psychosocial barriers to participation patient will remain free from psychosocial barriers to participation patient will remain free from psychosocial barriers to participation     Interventions Encouraged to attend Pulmonary Rehabilitation for the exercise Encouraged to attend Pulmonary  Rehabilitation for the exercise Encouraged to attend Pulmonary Rehabilitation for the exercise     Continue Psychosocial Services  No Follow up required No Follow up required No Follow up required        Psychosocial Discharge (Final Psychosocial Re-Evaluation):     Psychosocial Re-Evaluation - 06/03/17 0956      Psychosocial Re-Evaluation   Current issues with None Identified   Expected Outcomes patient will remain free from psychosocial barriers to participation   Interventions Encouraged to attend Pulmonary Rehabilitation for the exercise   Continue Psychosocial Services  No Follow up required      Education: Education Goals: Education classes will be provided on a weekly basis, covering required topics. Participant will state understanding/return demonstration of topics presented.  Learning Barriers/Preferences:     Learning Barriers/Preferences - 03/25/17 1417      Learning Barriers/Preferences   Learning Barriers None   Learning Preferences Written Material;Group Instruction;Individual Instruction;Skilled Demonstration;Verbal Instruction      Education Topics: Risk Factor Reduction:  -Group instruction that is supported by a PowerPoint presentation. Instructor discusses the definition of a risk factor, different risk factors for pulmonary disease, and how the heart and lungs work together.     PULMONARY REHAB OTHER RESPIRATORY from 06/02/2017 in [place]  Date  04/07/17  Educator  EP  Instruction Review Code  2- meets goals/outcomes      Nutrition for Pulmonary Patient:  -Group instruction provided by PowerPoint slides, verbal discussion, and written materials to support subject matter. The instructor gives an explanation and review of healthy diet recommendations, which includes a discussion on weight management, recommendations for fruit and vegetable consumption, as well as protein, fluid, caffeine, fiber, sodium, sugar, and  alcohol. Tips for eating when patients are short of breath are discussed.   PULMONARY REHAB OTHER RESPIRATORY from 06/02/2017 in [place]  Date  05/05/17  Educator  [employer]  Instruction Review Code  2- meets goals/outcomes      Pursed Lip Breathing:  -Group instruction that is supported by demonstration and informational handouts. Instructor discusses the benefits of pursed lip and diaphragmatic breathing and detailed demonstration on how to preform both.     PULMONARY REHAB OTHER RESPIRATORY from 06/02/2017 in [place]  Date  05/19/17  Educator  RT  Instruction Review Code  2- meets goals/outcomes      Oxygen Safety:  -Group instruction provided by PowerPoint, verbal discussion, and written material to support subject matter. There is an overview of "What is Oxygen" and "Why do we need it".  Instructor also reviews how to create a safe environment for oxygen use, the importance of using oxygen as prescribed, and the risks of noncompliance. There is a brief discussion on traveling with oxygen and resources the patient may utilize.   PULMONARY REHAB OTHER RESPIRATORY from 06/02/2017 in [place]  Date  04/21/17  Educator  Truddie Crumble  Instruction Review Code  2- meets goals/outcomes      Oxygen Equipment:  -Group instruction provided by [employer] Staff utilizing handouts, written materials, and equipment demonstrations.  Signs and Symptoms:  -Group instruction provided by written material and verbal discussion to support subject matter. Warning signs and symptoms of infection, stroke, and heart attack are reviewed and when to call the physician/911 reinforced. Tips for preventing the spread of infection discussed.   PULMONARY REHAB OTHER RESPIRATORY from 06/02/2017 in [place]  Date  06/02/17  Educator  RN  Instruction Review Code  2- meets goals/outcomes       Advanced Directives:  -Group instruction provided by verbal instruction and written material to support subject matter. Instructor reviews Advanced Directive laws and proper instruction for filling out document.   Pulmonary Video:  -Group video education that reviews the importance of medication and oxygen compliance, exercise, good nutrition, pulmonary hygiene, and pursed lip and diaphragmatic breathing for the pulmonary patient.   Exercise for the Pulmonary Patient:  -Group instruction that is supported by a PowerPoint presentation. Instructor discusses benefits of exercise, core components of exercise, frequency, duration, and intensity of an exercise routine, importance of utilizing pulse oximetry during exercise, safety while exercising, and options of places to exercise outside of rehab.     PULMONARY REHAB OTHER RESPIRATORY from 06/02/2017 in [place]  Date  05/24/17  Educator  ep  Instruction Review Code  2- meets goals/outcomes      Pulmonary Medications:  -Verbally interactive group education provided by instructor with focus on inhaled medications and proper administration.   PULMONARY REHAB OTHER RESPIRATORY from 06/02/2017 in [place]  Date  05/12/17  Educator  pharmacist  Instruction Review Code  2- meets goals/outcomes      Anatomy and Physiology of the Respiratory System and Intimacy:  -Group instruction provided by PowerPoint, verbal discussion, and written material to support subject matter. Instructor reviews respiratory cycle and anatomical components of the respiratory system and their functions. Instructor also reviews differences in obstructive and restrictive respiratory diseases with examples of each. Intimacy, Sex, and Sexuality differences are reviewed with a discussion on how relationships can change when diagnosed with pulmonary disease. Common sexual concerns are reviewed.   MD DAY -A  group question and answer session with a medical doctor that allows participants to ask questions that relate to their pulmonary disease state.   OTHER EDUCATION -Group or individual verbal, written, or video instructions that support the educational goals of the pulmonary rehab program.   Knowledge Questionnaire Score:     Knowledge Questionnaire Score - 04/12/17 0856      Knowledge Questionnaire Score   Pre Score 13/13      Core Components/Risk Factors/Patient Goals at Admission:     Personal Goals and Risk Factors at Admission - 03/25/17 1423      Core Components/Risk Factors/Patient Goals on Admission   Improve shortness of breath with ADL's Yes   Intervention Provide education, individualized exercise plan and daily activity instruction to help decrease symptoms of SOB with activities of daily living.   Expected Outcomes Short Term: Achieves a reduction of symptoms when performing activities of daily living.   Heart Failure Yes   Intervention Provide a combined exercise and nutrition program that is supplemented with education, support and counseling about heart failure. Directed toward relieving symptoms such as shortness of breath, decreased exercise tolerance, and extremity edema.   Expected Outcomes Improve functional capacity of life;Short term: Attendance in program 2-3 days a week with increased exercise capacity. Reported lower sodium intake. Reported increased fruit and vegetable intake. Reports medication  compliance.;Short term: Daily weights obtained and reported for increase. Utilizing diuretic protocols set by physician.;Long term: Adoption of self-care skills and reduction of barriers for early signs and symptoms recognition and intervention leading to self-care maintenance.      Core Components/Risk Factors/Patient Goals Review:      Goals and Risk Factor Review    Row Name 04/12/17 1405 05/11/17 1408 06/03/17 0954         Core Components/Risk  Factors/Patient Goals Review   Personal Goals Review Improve shortness of breath with ADL's;Heart Failure Improve shortness of breath with ADL's;Heart Failure Improve shortness of breath with ADL's;Heart Failure     Review patient has only attended 2 sessions since admission and it is to soon to evaluate progress towards goals patient is beginning to see an improvement with his shortness of breath. His weight remains constant and does not show signs of fluid overload.  Patient and wife admit to seeing improvement in shortness of breath. He is able to do more around the house and walk farther with less restbreaks. His weight remains constant and he continues to not show signs of fluid overload. Patient is HOH however his wife is an excellent caregiver and always reinforces any HF education patient recieves.     Expected Outcomes see admission expected outcomes see admission expected outcomes see admission expected outcomes        Core Components/Risk Factors/Patient Goals at Discharge (Final Review):      Goals and Risk Factor Review - 06/03/17 0954      Core Components/Risk Factors/Patient Goals Review   Personal Goals Review Improve shortness of breath with ADL's;Heart Failure   Review Patient and wife admit to seeing improvement in shortness of breath. He is able to do more around the house and walk farther with less restbreaks. His weight remains constant and he continues to not show signs of fluid overload. Patient is HOH however his wife is an excellent caregiver and always reinforces any HF education patient recieves.   Expected Outcomes see admission expected outcomes      ITP Comments:   Comments: ITP REVIEW Pt is making expected progress toward pulmonary rehab goals after completing 17 sessions. Recommend continued exercise, life style modification, education, and utilization of breathing techniques to increase stamina and strength and decrease shortness of breath with exertion.

## 2017-06-07 ENCOUNTER — Encounter (HOSPITAL_COMMUNITY)
Admission: RE | Admit: 2017-06-07 | Discharge: 2017-06-07 | Disposition: A | Discharge status: Home / Self Care | Payer: Medicare Other | Source: Ambulatory Visit | Attending: Cardiology | Admitting: Cardiology

## 2017-06-07 VITALS — Wt 168.0 lb

## 2017-06-07 DIAGNOSIS — Z7982 Long term (current) use of aspirin: Secondary | ICD-10-CM | POA: Diagnosis not present

## 2017-06-07 DIAGNOSIS — I509 Heart failure, unspecified: Secondary | ICD-10-CM | POA: Diagnosis not present

## 2017-06-07 DIAGNOSIS — I110 Hypertensive heart disease with heart failure: Secondary | ICD-10-CM | POA: Diagnosis not present

## 2017-06-07 DIAGNOSIS — E119 Type 2 diabetes mellitus without complications: Secondary | ICD-10-CM | POA: Diagnosis not present

## 2017-06-07 DIAGNOSIS — Z79899 Other long term (current) drug therapy: Secondary | ICD-10-CM | POA: Diagnosis not present

## 2017-06-07 DIAGNOSIS — E785 Hyperlipidemia, unspecified: Secondary | ICD-10-CM | POA: Diagnosis not present

## 2017-06-07 NOTE — Progress Notes (Signed)
Daily Session Note  Patient Details  Name: Calvin Gray MRN: [id number] Date of Birth: [birth year] Referring Provider:     Pulmonary Rehab Walk Test from 03/31/2017 in [place]  Referring Provider  Dr. Einar Gip      Encounter Date: 06/07/2017  Check In:     Session Check In - 06/07/17 1030      Check-In   Location [facility]   Staff Present Su Hilt, MS, ACSM RCEP, Exercise Physiologist;Portia Rollene Rotunda, RN, Roque Cash, RN   Supervising physician immediately available to respond to emergencies [facility] immediately available   Physician(s) Dr. Wynelle Cleveland   Medication changes reported     No   Fall or balance concerns reported    No   Tobacco Cessation No Change   Warm-up and Cool-down Performed as group-led instruction   Resistance Training Performed Yes   VAD Patient? No     Pain Assessment   Currently in Pain? No/denies   Multiple Pain Sites No      Capillary Blood Glucose: No results found for this or any previous visit (from the past 24 hour(s)).      Exercise Prescription Changes - 06/07/17 1200      Response to Exercise   Blood Pressure (Admit) 138/60   Blood Pressure (Exercise) 120/70   Blood Pressure (Exit) 100/60   Heart Rate (Admit) 69 bpm   Heart Rate (Exercise) 93 bpm   Heart Rate (Exit) 73 bpm   Oxygen Saturation (Admit) 99 %   Oxygen Saturation (Exercise) 98 %   Oxygen Saturation (Exit) 98 %   Rating of Perceived Exertion (Exercise) 11   Perceived Dyspnea (Exercise) 1   Duration Continue with 45 min of aerobic exercise without signs/symptoms of physical distress.   Intensity THRR unchanged     Progression   Progression Continue to progress workloads to maintain intensity without signs/symptoms of physical distress.     Resistance Training   Training Prescription Yes   Weight orange bands   Reps 10-15   Time 10 Minutes     Bike   Level 1   Minutes 17     NuStep   Level 5   Minutes 17   METs 2.3     Track   Laps 10   Minutes 17      History  Smoking Status  . Never Smoker  Smokeless Tobacco  . Never Used    Goals Met:  Exercise tolerated well No report of cardiac concerns or symptoms Strength training completed today  Goals Unmet:  Not Applicable  Comments: Service time is from 10:30a to 12:05p    Dr. Rush Farmer is Medical Director for Pulmonary Rehab at [facility].

## 2017-06-09 ENCOUNTER — Encounter (HOSPITAL_COMMUNITY)
Admission: RE | Admit: 2017-06-09 | Discharge: 2017-06-09 | Disposition: A | Discharge status: Home / Self Care | Payer: Medicare Other | Source: Ambulatory Visit | Attending: Cardiology | Admitting: Cardiology

## 2017-06-09 VITALS — Wt 167.3 lb

## 2017-06-09 DIAGNOSIS — Z7982 Long term (current) use of aspirin: Secondary | ICD-10-CM | POA: Diagnosis not present

## 2017-06-09 DIAGNOSIS — I110 Hypertensive heart disease with heart failure: Secondary | ICD-10-CM | POA: Diagnosis not present

## 2017-06-09 DIAGNOSIS — E119 Type 2 diabetes mellitus without complications: Secondary | ICD-10-CM | POA: Diagnosis not present

## 2017-06-09 DIAGNOSIS — I509 Heart failure, unspecified: Secondary | ICD-10-CM

## 2017-06-09 DIAGNOSIS — E785 Hyperlipidemia, unspecified: Secondary | ICD-10-CM | POA: Diagnosis not present

## 2017-06-09 DIAGNOSIS — Z79899 Other long term (current) drug therapy: Secondary | ICD-10-CM | POA: Diagnosis not present

## 2017-06-09 NOTE — Progress Notes (Signed)
Daily Session Note  Patient Details  Name: Calvin Gray MRN: [id number] Date of Birth: 1925-11-12 Referring Provider:     Pulmonary Rehab Walk Test from 03/31/2017 in [place]  Referring Provider  Dr. Einar Gip      Encounter Date: 06/09/2017  Check In:     Session Check In - 06/09/17 1028      Check-In   [facility]   Staff Present Trish Fountain, RN, BSN;Lisa Ysidro Evert, RN;Erhardt Dada, MS, ACSM RCEP, Exercise Physiologist   Supervising physician immediately available to respond to emergencies [facility] immediately available   Physician(s) Dr. Wendee Beavers   Medication changes reported     No   Fall or balance concerns reported    No   Tobacco Cessation No Change   Warm-up and Cool-down Performed as group-led instruction   Resistance Training Performed Yes   VAD Patient? No     Pain Assessment   Currently in Pain? No/denies   Multiple Pain Sites No      Capillary Blood Glucose: No results found for this or any previous visit (from the past 24 hour(s)).      Exercise Prescription Changes - 06/09/17 1200      Response to Exercise   Blood Pressure (Admit) 118/62   Blood Pressure (Exercise) 110/60   Blood Pressure (Exit) 102/62   Heart Rate (Admit) 75 bpm   Heart Rate (Exercise) 95 bpm   Heart Rate (Exit) 71 bpm   Oxygen Saturation (Admit) 98 %   Oxygen Saturation (Exercise) 98 %   Oxygen Saturation (Exit) 98 %   Rating of Perceived Exertion (Exercise) 13   Perceived Dyspnea (Exercise) 1   Duration Continue with 45 min of aerobic exercise without signs/symptoms of physical distress.   Intensity THRR unchanged     Progression   Progression Continue to progress workloads to maintain intensity without signs/symptoms of physical distress.     Resistance Training   Training Prescription Yes   Weight orange bands   Reps 10-15   Time 10 Minutes     NuStep   Level 5   Minutes 17   METs 2.9     Track   Laps 10   Minutes 17      History  Smoking Status  . Never Smoker  Smokeless Tobacco  . Never Used    Goals Met:  Exercise tolerated well No report of cardiac concerns or symptoms Strength training completed today  Goals Unmet:  Not Applicable  Comments: Service time is from 10:30A to 12:10P    Dr. Rush Farmer is Medical Director for Pulmonary Rehab at [facility].

## 2017-06-10 DIAGNOSIS — N401 Enlarged prostate with lower urinary tract symptoms: Secondary | ICD-10-CM | POA: Diagnosis not present

## 2017-06-10 DIAGNOSIS — R972 Elevated prostate specific antigen [PSA]: Secondary | ICD-10-CM | POA: Diagnosis not present

## 2017-06-10 DIAGNOSIS — R351 Nocturia: Secondary | ICD-10-CM | POA: Diagnosis not present

## 2017-06-14 ENCOUNTER — Encounter (HOSPITAL_COMMUNITY)
Admission: RE | Admit: 2017-06-14 | Discharge: 2017-06-14 | Disposition: A | Discharge status: Home / Self Care | Payer: Medicare Other | Source: Ambulatory Visit | Attending: Cardiology | Admitting: Cardiology

## 2017-06-14 ENCOUNTER — Encounter (HOSPITAL_COMMUNITY): Payer: Self-pay

## 2017-06-14 VITALS — Wt 165.3 lb

## 2017-06-14 DIAGNOSIS — Z7982 Long term (current) use of aspirin: Secondary | ICD-10-CM | POA: Diagnosis not present

## 2017-06-14 DIAGNOSIS — E119 Type 2 diabetes mellitus without complications: Secondary | ICD-10-CM | POA: Insufficient documentation

## 2017-06-14 DIAGNOSIS — I509 Heart failure, unspecified: Secondary | ICD-10-CM | POA: Insufficient documentation

## 2017-06-14 DIAGNOSIS — I110 Hypertensive heart disease with heart failure: Secondary | ICD-10-CM | POA: Diagnosis not present

## 2017-06-14 DIAGNOSIS — Z79899 Other long term (current) drug therapy: Secondary | ICD-10-CM | POA: Diagnosis not present

## 2017-06-14 DIAGNOSIS — H9190 Unspecified hearing loss, unspecified ear: Secondary | ICD-10-CM | POA: Insufficient documentation

## 2017-06-14 DIAGNOSIS — E785 Hyperlipidemia, unspecified: Secondary | ICD-10-CM | POA: Diagnosis not present

## 2017-06-14 NOTE — Progress Notes (Signed)
Daily Session Note  Patient Details  Name: Calvin Gray MRN: [id number] Date of Birth: [birth year] Referring Provider:     Pulmonary Rehab Walk Test from 03/31/2017 in [place]  Referring Provider  Dr. Einar Gip      Encounter Date: 06/14/2017  Check In:     Session Check In - 06/14/17 1226      Check-In   [facility]   Staff Present Su Hilt, MS, ACSM RCEP, Exercise Physiologist;Lisa Ysidro Evert, RN;Other   Supervising physician immediately available to respond to emergencies [facility] immediately available   Physician(s) Dr. Wendee Beavers   Medication changes reported     No   Fall or balance concerns reported    No   Tobacco Cessation No Change   Resistance Training Performed Yes   VAD Patient? No     Pain Assessment   Currently in Pain? No/denies   Multiple Pain Sites No      Capillary Blood Glucose: No results found for this or any previous visit (from the past 24 hour(s)).      Exercise Prescription Changes - 06/14/17 1200      Response to Exercise   Blood Pressure (Admit) 112/62   Blood Pressure (Exercise) 124/80   Blood Pressure (Exit) 98/62   Heart Rate (Admit) 78 bpm   Heart Rate (Exercise) 98 bpm   Heart Rate (Exit) 84 bpm   Oxygen Saturation (Admit) 97 %   Oxygen Saturation (Exercise) 98 %   Oxygen Saturation (Exit) 97 %   Rating of Perceived Exertion (Exercise) 12   Perceived Dyspnea (Exercise) 1   Duration Progress to 45 minutes of aerobic exercise without signs/symptoms of physical distress   Intensity THRR unchanged     Progression   Progression Continue to progress workloads to maintain intensity without signs/symptoms of physical distress.     Resistance Training   Training Prescription Yes   Weight --  ornage bands    Reps 10-15   Time --  10     Bike   Level 1   Minutes 17     NuStep   Level 5   Minutes 17   METs 3.1     Track   Laps 9   Minutes 17     Home  Exercise Plan   Plans to continue exercise at Home (comment)   Frequency Add 3 additional days to program exercise sessions.      History  Smoking Status  . Never Smoker  Smokeless Tobacco  . Never Used    Goals Met:  Exercise tolerated well  Goals Unmet:  Not Applicable  Comments: Service time is from 1030to 1210    Dr. Rush Farmer is Medical Director for Pulmonary Rehab at [facility].

## 2017-06-16 ENCOUNTER — Encounter (HOSPITAL_COMMUNITY)
Admission: RE | Admit: 2017-06-16 | Discharge: 2017-06-16 | Disposition: A | Discharge status: Home / Self Care | Payer: Medicare Other | Source: Ambulatory Visit | Attending: Cardiology | Admitting: Cardiology

## 2017-06-16 VITALS — Wt 164.9 lb

## 2017-06-16 DIAGNOSIS — I509 Heart failure, unspecified: Secondary | ICD-10-CM | POA: Diagnosis not present

## 2017-06-16 DIAGNOSIS — I110 Hypertensive heart disease with heart failure: Secondary | ICD-10-CM | POA: Diagnosis not present

## 2017-06-16 DIAGNOSIS — Z79899 Other long term (current) drug therapy: Secondary | ICD-10-CM | POA: Diagnosis not present

## 2017-06-16 DIAGNOSIS — E785 Hyperlipidemia, unspecified: Secondary | ICD-10-CM | POA: Diagnosis not present

## 2017-06-16 DIAGNOSIS — Z7982 Long term (current) use of aspirin: Secondary | ICD-10-CM | POA: Diagnosis not present

## 2017-06-16 DIAGNOSIS — E119 Type 2 diabetes mellitus without complications: Secondary | ICD-10-CM | POA: Diagnosis not present

## 2017-06-16 NOTE — Progress Notes (Signed)
Daily Session Note  Patient Details  Name: Calvin Gray MRN: [id number] Date of Birth: 07/25/1926 Referring Provider:     Pulmonary Rehab Walk Test from 03/31/2017 in [place]  Referring Provider  Dr. Einar Gip      Encounter Date: 06/16/2017  Check In:     Session Check In - 06/16/17 1101      Check-In   [facility]   Staff Present Su Hilt, MS, ACSM RCEP, Exercise Physiologist;Lisa Ysidro Evert, RN   Supervising physician immediately available to respond to emergencies [facility] immediately available   Physician(s) Dr. Zigmund Daniel   Medication changes reported     No   Fall or balance concerns reported    No   Tobacco Cessation No Change   Warm-up and Cool-down Performed as group-led instruction   Resistance Training Performed Yes   VAD Patient? No     Pain Assessment   Currently in Pain? No/denies   Multiple Pain Sites No      Capillary Blood Glucose: No results found for this or any previous visit (from the past 24 hour(s)).      Exercise Prescription Changes - 06/16/17 1300      Response to Exercise   Blood Pressure (Admit) 116/58   Blood Pressure (Exercise) 110/50   Blood Pressure (Exit) 112/62   Heart Rate (Admit) 77 bpm   Heart Rate (Exercise) 91 bpm   Heart Rate (Exit) 77 bpm   Oxygen Saturation (Admit) 98 %   Oxygen Saturation (Exercise) 98 %   Oxygen Saturation (Exit) 96 %   Rating of Perceived Exertion (Exercise) 11   Perceived Dyspnea (Exercise) 1   Duration Progress to 45 minutes of aerobic exercise without signs/symptoms of physical distress   Intensity THRR unchanged     Progression   Progression Continue to progress workloads to maintain intensity without signs/symptoms of physical distress.     Resistance Training   Training Prescription Yes   Weight --  ornage bands    Reps 10-15   Time --  10     NuStep   Level 5   Minutes 17   METs 2.9     Track   Laps 10   Minutes 17      History  Smoking Status  . Never Smoker  Smokeless Tobacco  . Never Used    Goals Met:  Exercise tolerated well No report of cardiac concerns or symptoms Strength training completed today  Goals Unmet:  Not Applicable  Comments: Service time is from 10:30a to 12:23p    Dr. Rush Farmer is Medical Director for Pulmonary Rehab at [facility].

## 2017-06-21 ENCOUNTER — Encounter (HOSPITAL_COMMUNITY)
Admission: RE | Admit: 2017-06-21 | Discharge: 2017-06-21 | Disposition: A | Discharge status: Home / Self Care | Payer: Medicare Other | Source: Ambulatory Visit | Attending: Cardiology | Admitting: Cardiology

## 2017-06-21 VITALS — Wt 164.0 lb

## 2017-06-21 DIAGNOSIS — Z79899 Other long term (current) drug therapy: Secondary | ICD-10-CM | POA: Diagnosis not present

## 2017-06-21 DIAGNOSIS — E785 Hyperlipidemia, unspecified: Secondary | ICD-10-CM | POA: Diagnosis not present

## 2017-06-21 DIAGNOSIS — Z7982 Long term (current) use of aspirin: Secondary | ICD-10-CM | POA: Diagnosis not present

## 2017-06-21 DIAGNOSIS — I509 Heart failure, unspecified: Secondary | ICD-10-CM

## 2017-06-21 DIAGNOSIS — E119 Type 2 diabetes mellitus without complications: Secondary | ICD-10-CM | POA: Diagnosis not present

## 2017-06-21 DIAGNOSIS — I110 Hypertensive heart disease with heart failure: Secondary | ICD-10-CM | POA: Diagnosis not present

## 2017-06-21 NOTE — Progress Notes (Signed)
Daily Session Note  Patient Details  Name: Calvin Gray MRN: [id number] Date of Birth: 15-Mar-1926 Referring Provider:     Pulmonary Rehab Walk Test from 03/31/2017 in [place]  Referring Provider  Dr. Einar Gip      Encounter Date: 06/21/2017  Check In:     Session Check In - 06/21/17 1021      Check-In   [facility]   Staff Present Trish Fountain, RN, BSN;Ayleah Hofmeister Ysidro Evert, RN;Molly diVincenzo, MS, ACSM RCEP, Exercise Physiologist   Supervising physician immediately available to respond to emergencies [facility] immediately available   Physician(s) Dr. Zigmund Daniel   Medication changes reported     No   Fall or balance concerns reported    No   Tobacco Cessation No Change   Warm-up and Cool-down Performed as group-led instruction   Resistance Training Performed Yes   VAD Patient? No     Pain Assessment   Currently in Pain? No/denies   Multiple Pain Sites No      Capillary Blood Glucose: No results found for this or any previous visit (from the past 24 hour(s)).      Exercise Prescription Changes - 06/21/17 1200      Response to Exercise   Blood Pressure (Admit) 120/60   Blood Pressure (Exercise) 124/72   Blood Pressure (Exit) 126/68   Heart Rate (Admit) 70 bpm   Heart Rate (Exercise) 84 bpm   Heart Rate (Exit) 66 bpm   Oxygen Saturation (Admit) 98 %   Oxygen Saturation (Exercise) 99 %   Oxygen Saturation (Exit) 97 %   Rating of Perceived Exertion (Exercise) 12   Perceived Dyspnea (Exercise) 1   Duration Progress to 45 minutes of aerobic exercise without signs/symptoms of physical distress   Intensity THRR unchanged     Progression   Progression Continue to progress workloads to maintain intensity without signs/symptoms of physical distress.     Resistance Training   Training Prescription Yes   Weight orange bands   Reps 10-15   Time 10 Minutes     NuStep   Level 5   Minutes 17   METs 2.8     Track    Laps 11   Minutes 17      History  Smoking Status  . Never Smoker  Smokeless Tobacco  . Never Used    Goals Met:  Exercise tolerated well No report of cardiac concerns or symptoms Strength training completed today  Goals Unmet:  Not Applicable  Comments: Service time is from 1030 to 1230    Dr. Rush Farmer is Medical Director for Pulmonary Rehab at [facility].

## 2017-06-23 ENCOUNTER — Encounter (HOSPITAL_COMMUNITY)
Admission: RE | Admit: 2017-06-23 | Discharge: 2017-06-23 | Disposition: A | Discharge status: Home / Self Care | Payer: Medicare Other | Source: Ambulatory Visit | Attending: Cardiology | Admitting: Cardiology

## 2017-06-23 VITALS — Wt 164.5 lb

## 2017-06-23 DIAGNOSIS — I509 Heart failure, unspecified: Secondary | ICD-10-CM

## 2017-06-23 DIAGNOSIS — Z7982 Long term (current) use of aspirin: Secondary | ICD-10-CM | POA: Diagnosis not present

## 2017-06-23 DIAGNOSIS — I110 Hypertensive heart disease with heart failure: Secondary | ICD-10-CM | POA: Diagnosis not present

## 2017-06-23 DIAGNOSIS — E119 Type 2 diabetes mellitus without complications: Secondary | ICD-10-CM | POA: Diagnosis not present

## 2017-06-23 DIAGNOSIS — Z79899 Other long term (current) drug therapy: Secondary | ICD-10-CM | POA: Diagnosis not present

## 2017-06-23 DIAGNOSIS — E785 Hyperlipidemia, unspecified: Secondary | ICD-10-CM | POA: Diagnosis not present

## 2017-06-23 NOTE — Progress Notes (Signed)
Daily Session Note  Patient Details  Name: Calvin Gray MRN: [id number] Date of Birth: Jan 08, 1926 Referring Provider:     Pulmonary Rehab Walk Test from 03/31/2017 in [place]  Referring Provider  Dr. Einar Gip      Encounter Date: 06/23/2017  Check In:     Session Check In - 06/23/17 1020      Check-In   Location [facility]   Staff Present Trish Fountain, RN, BSN;Rachella Basden Ysidro Evert, RN;Molly diVincenzo, MS, ACSM RCEP, Exercise Physiologist   Supervising physician immediately available to respond to emergencies [facility] immediately available   Physician(s) Dr. Verlon Au   Medication changes reported     No   Fall or balance concerns reported    No   Tobacco Cessation No Change   Warm-up and Cool-down Performed as group-led instruction   Resistance Training Performed Yes   VAD Patient? No     Pain Assessment   Currently in Pain? No/denies   Multiple Pain Sites No      Capillary Blood Glucose: No results found for this or any previous visit (from the past 24 hour(s)).      Exercise Prescription Changes - 06/23/17 1200      Response to Exercise   Blood Pressure (Admit) 106/52   Blood Pressure (Exercise) 120/60   Blood Pressure (Exit) 104/56   Heart Rate (Admit) 79 bpm   Heart Rate (Exercise) 96 bpm   Heart Rate (Exit) 80 bpm   Oxygen Saturation (Admit) 99 %   Oxygen Saturation (Exercise) 97 %   Oxygen Saturation (Exit) 95 %   Rating of Perceived Exertion (Exercise) 12   Perceived Dyspnea (Exercise) 1   Duration Progress to 45 minutes of aerobic exercise without signs/symptoms of physical distress   Intensity THRR unchanged     Progression   Progression Continue to progress workloads to maintain intensity without signs/symptoms of physical distress.     Resistance Training   Training Prescription Yes   Weight orange bands   Reps 10-15   Time 10 Minutes     Bike   Level 1   Minutes 17     NuStep   Level 5   Minutes 17   METs 3     Track   Laps 12   Minutes 17      History  Smoking Status  . Never Smoker  Smokeless Tobacco  . Never Used    Goals Met:  Exercise tolerated well No report of cardiac concerns or symptoms Strength training completed today  Goals Unmet:  Not Applicable  Comments: Service time is from 1030 to 1210    Dr. Rush Farmer is Medical Director for Pulmonary Rehab at [facility].

## 2017-06-28 ENCOUNTER — Encounter (HOSPITAL_COMMUNITY)
Admission: RE | Admit: 2017-06-28 | Discharge: 2017-06-28 | Disposition: A | Discharge status: Home / Self Care | Payer: Medicare Other | Source: Ambulatory Visit | Attending: Cardiology | Admitting: Cardiology

## 2017-06-28 VITALS — Wt 164.7 lb

## 2017-06-28 DIAGNOSIS — Z79899 Other long term (current) drug therapy: Secondary | ICD-10-CM | POA: Diagnosis not present

## 2017-06-28 DIAGNOSIS — E785 Hyperlipidemia, unspecified: Secondary | ICD-10-CM | POA: Diagnosis not present

## 2017-06-28 DIAGNOSIS — I509 Heart failure, unspecified: Secondary | ICD-10-CM | POA: Diagnosis not present

## 2017-06-28 DIAGNOSIS — Z7982 Long term (current) use of aspirin: Secondary | ICD-10-CM | POA: Diagnosis not present

## 2017-06-28 DIAGNOSIS — E119 Type 2 diabetes mellitus without complications: Secondary | ICD-10-CM | POA: Diagnosis not present

## 2017-06-28 DIAGNOSIS — I110 Hypertensive heart disease with heart failure: Secondary | ICD-10-CM | POA: Diagnosis not present

## 2017-06-28 NOTE — Progress Notes (Signed)
Daily Session Note  Patient Details  Name: Calvin Gray MRN: [id number] Date of Birth: 07-05-1926 Referring Provider:     Pulmonary Rehab Walk Test from 03/31/2017 in [place]  Referring Provider  Dr. Einar Gip      Encounter Date: 06/28/2017  Check In:     Session Check In - 06/28/17 1236      Check-In   Location [facility]      Capillary Blood Glucose: No results found for this or any previous visit (from the past 24 hour(s)).      Exercise Prescription Changes - 06/28/17 1200      Response to Exercise   Blood Pressure (Admit) 126/64   Blood Pressure (Exercise) 124/52   Blood Pressure (Exit) 114/62   Heart Rate (Admit) 71 bpm   Heart Rate (Exercise) 95 bpm   Heart Rate (Exit) 75 bpm   Oxygen Saturation (Admit) 99 %   Oxygen Saturation (Exercise) 98 %   Oxygen Saturation (Exit) 97 %   Rating of Perceived Exertion (Exercise) 11   Perceived Dyspnea (Exercise) 1   Duration Progress to 45 minutes of aerobic exercise without signs/symptoms of physical distress   Intensity THRR unchanged     Progression   Progression Continue to progress workloads to maintain intensity without signs/symptoms of physical distress.     Resistance Training   Training Prescription Yes   Weight orange bands   Reps 10-15   Time 10 Minutes     Bike   Level 1   Minutes 17     NuStep   Level 6   Minutes 17   METs 3.2     Track   Laps 11   Minutes 17      History  Smoking Status  . Never Smoker  Smokeless Tobacco  . Never Used    Goals Met:  Exercise tolerated well No report of cardiac concerns or symptoms Strength training completed today  Goals Unmet:  Not Applicable  Comments: Service time is from 10:30a to 12:10p    Dr. Rush Farmer is Medical Director for Pulmonary Rehab at [facility].

## 2017-06-30 ENCOUNTER — Encounter (HOSPITAL_COMMUNITY)
Admission: RE | Admit: 2017-06-30 | Discharge: 2017-06-30 | Disposition: A | Discharge status: Home / Self Care | Payer: Medicare Other | Source: Ambulatory Visit | Attending: Cardiology | Admitting: Cardiology

## 2017-06-30 DIAGNOSIS — I509 Heart failure, unspecified: Secondary | ICD-10-CM

## 2017-06-30 DIAGNOSIS — Z79899 Other long term (current) drug therapy: Secondary | ICD-10-CM | POA: Diagnosis not present

## 2017-06-30 DIAGNOSIS — E119 Type 2 diabetes mellitus without complications: Secondary | ICD-10-CM | POA: Diagnosis not present

## 2017-06-30 DIAGNOSIS — E785 Hyperlipidemia, unspecified: Secondary | ICD-10-CM | POA: Diagnosis not present

## 2017-06-30 DIAGNOSIS — Z7982 Long term (current) use of aspirin: Secondary | ICD-10-CM | POA: Diagnosis not present

## 2017-06-30 DIAGNOSIS — I110 Hypertensive heart disease with heart failure: Secondary | ICD-10-CM | POA: Diagnosis not present

## 2017-07-05 ENCOUNTER — Encounter (HOSPITAL_COMMUNITY): Payer: Medicare Other

## 2017-07-07 ENCOUNTER — Encounter (HOSPITAL_COMMUNITY): Payer: Medicare Other

## 2017-07-08 ENCOUNTER — Encounter (HOSPITAL_COMMUNITY): Payer: Self-pay

## 2017-07-08 DIAGNOSIS — I509 Heart failure, unspecified: Secondary | ICD-10-CM

## 2017-07-08 NOTE — Progress Notes (Signed)
Discharge Progress Report  Patient Details  Name: Calvin Gray MRN: [id number] Date of Birth: March 09, 1926 Referring Provider:     Pulmonary Rehab Walk Test from 03/31/2017 in [place]  Referring Provider  Dr. Einar Gip       Number of Visits: 25  Reason for Discharge:  Patient reached a stable level of exercise. Patient independent in their exercise. Patient has met program and personal goals.  Smoking History:  History  Smoking Status  . Never Smoker  Smokeless Tobacco  . Never Used    Diagnosis:  Congestive heart failure, unspecified HF chronicity, unspecified heart failure type ([place])  ADL UCSD:     Pulmonary Assessment Scores    Row Name 06/29/17 1000 06/30/17 1646       ADL UCSD   ADL Phase Exit Exit    SOB Score total 3  -      CAT Score   CAT Score 9  Exit  -      mMRC Score   mMRC Score  - 1       Initial Exercise Prescription:   Discharge Exercise Prescription (Final Exercise Prescription Changes):     Exercise Prescription Changes - 06/28/17 1200      Response to Exercise   Blood Pressure (Admit) 126/64   Blood Pressure (Exercise) 124/52   Blood Pressure (Exit) 114/62   Heart Rate (Admit) 71 bpm   Heart Rate (Exercise) 95 bpm   Heart Rate (Exit) 75 bpm   Oxygen Saturation (Admit) 99 %   Oxygen Saturation (Exercise) 98 %   Oxygen Saturation (Exit) 97 %   Rating of Perceived Exertion (Exercise) 11   Perceived Dyspnea (Exercise) 1   Duration Progress to 45 minutes of aerobic exercise without signs/symptoms of physical distress   Intensity THRR unchanged     Progression   Progression Continue to progress workloads to maintain intensity without signs/symptoms of physical distress.     Resistance Training   Training Prescription Yes   Weight orange bands   Reps 10-15   Time 10 Minutes     Bike   Level 1   Minutes 17     NuStep   Level 6   Minutes 17   METs 3.2     Track   Laps 11   Minutes 17       Functional Capacity:     [place] Name 06/30/17 1643         6 Minute Walk   Phase Discharge     Distance 1236 feet     Distance Feet Change 336 ft     Walk Time 6 minutes     # of Rest Breaks 0     MPH 2.34     METS 2.76     RPE 13     Perceived Dyspnea  3     Symptoms No     Resting HR 73 bpm     Resting BP 112/64     Resting Oxygen Saturation  98 %     Exercise Oxygen Saturation  during 6 min walk 98 %     Max Ex. HR 94 bpm     Max Ex. BP 140/60       Interval HR   1 Minute HR 73     2 Minute HR 80     3 Minute HR 89     4 Minute HR 91  5 Minute HR 92     6 Minute HR 94     2 Minute Post HR 85     Interval Heart Rate? Yes       Interval Oxygen   Interval Oxygen? Yes     Baseline Oxygen Saturation % 98 %     1 Minute Oxygen Saturation % 98 %     1 Minute Liters of Oxygen 0 L     2 Minute Oxygen Saturation % 98 %     2 Minute Liters of Oxygen 0 L     3 Minute Oxygen Saturation % 98 %     3 Minute Liters of Oxygen 0 L     4 Minute Oxygen Saturation % 99 %     4 Minute Liters of Oxygen 0 L     5 Minute Oxygen Saturation % 98 %     5 Minute Liters of Oxygen 0 L     6 Minute Oxygen Saturation % 100 %     6 Minute Liters of Oxygen 0 L     2 Minute Post Oxygen Saturation % 98 %     2 Minute Post Liters of Oxygen 0 L        Psychological, QOL, Others - Outcomes: PHQ 2/9: Depression screen PHQ 2/9 03/25/2017  Decreased Interest 0  Down, Depressed, Hopeless 0  PHQ - 2 Score 0    Quality of Life:   Personal Goals: Goals established at orientation with interventions provided to work toward goal.    Personal Goals Discharge:     Goals and Risk Factor Review    Row Name 05/11/17 1408 06/03/17 0954 07/08/17 1500         Core Components/Risk Factors/Patient Goals Review   Personal Goals Review Improve shortness of breath with ADL's;Heart Failure Improve shortness of breath with ADL's;Heart Failure Improve shortness of breath with  ADL's;Heart Failure     Review patient is beginning to see an improvement with his shortness of breath. His weight remains constant and does not show signs of fluid overload.  Patient and wife admit to seeing improvement in shortness of breath. He is able to do more around the house and walk farther with less restbreaks. His weight remains constant and he continues to not show signs of fluid overload. Patient is HOH however his wife is an excellent caregiver and always reinforces any HF education patient recieves. goals met at discharge     Expected Outcomes see admission expected outcomes see admission expected outcomes  -        Exercise Goals and Review:   Nutrition & Weight - Outcomes:      Post Biometrics - 06/30/17 1646       Post  Biometrics   Grip Strength 34 kg      Nutrition:     Nutrition Therapy & Goals - 05/12/17 1325      Nutrition Therapy   Diet General, Healthful     Personal Nutrition Goals   Nutrition Goal Describe the benefit of including fruits, vegetables, whole grains, and low-fat dairy products in a healthy meal plan.     Intervention Plan   Expected Outcomes Short Term Goal: Understand basic principles of dietary content, such as calories, fat, sodium, cholesterol and nutrients.;Long Term Goal: Adherence to prescribed nutrition plan.      Nutrition Discharge:     Nutrition Assessments - 07/08/17 1214      Rate Your Plate Scores   Pre  Score 56   Post Score 55      Education Questionnaire Score:     Knowledge Questionnaire Score - 06/29/17 0959      Knowledge Questionnaire Score   Post Score 13/13      Goals reviewed with patient

## 2017-07-12 ENCOUNTER — Encounter (HOSPITAL_COMMUNITY): Payer: Medicare Other

## 2017-07-14 ENCOUNTER — Encounter (HOSPITAL_COMMUNITY): Payer: Medicare Other

## 2017-07-19 ENCOUNTER — Encounter (HOSPITAL_COMMUNITY): Payer: Medicare Other

## 2017-07-21 ENCOUNTER — Encounter (HOSPITAL_COMMUNITY): Payer: Medicare Other

## 2017-07-25 DIAGNOSIS — Z125 Encounter for screening for malignant neoplasm of prostate: Secondary | ICD-10-CM | POA: Diagnosis not present

## 2017-07-25 DIAGNOSIS — N183 Chronic kidney disease, stage 3 (moderate): Secondary | ICD-10-CM | POA: Diagnosis not present

## 2017-07-25 DIAGNOSIS — H6122 Impacted cerumen, left ear: Secondary | ICD-10-CM | POA: Diagnosis not present

## 2017-07-25 DIAGNOSIS — E785 Hyperlipidemia, unspecified: Secondary | ICD-10-CM | POA: Diagnosis not present

## 2017-07-25 DIAGNOSIS — E559 Vitamin D deficiency, unspecified: Secondary | ICD-10-CM | POA: Diagnosis not present

## 2017-07-25 DIAGNOSIS — I2510 Atherosclerotic heart disease of native coronary artery without angina pectoris: Secondary | ICD-10-CM | POA: Diagnosis not present

## 2017-07-25 DIAGNOSIS — H60332 Swimmer's ear, left ear: Secondary | ICD-10-CM | POA: Diagnosis not present

## 2017-07-25 DIAGNOSIS — I10 Essential (primary) hypertension: Secondary | ICD-10-CM | POA: Diagnosis not present

## 2017-07-25 DIAGNOSIS — E1122 Type 2 diabetes mellitus with diabetic chronic kidney disease: Secondary | ICD-10-CM | POA: Diagnosis not present

## 2017-07-26 ENCOUNTER — Encounter (HOSPITAL_COMMUNITY): Payer: Medicare Other

## 2017-07-29 DIAGNOSIS — H60332 Swimmer's ear, left ear: Secondary | ICD-10-CM | POA: Diagnosis not present

## 2017-08-17 DIAGNOSIS — N183 Chronic kidney disease, stage 3 (moderate): Secondary | ICD-10-CM | POA: Diagnosis not present

## 2017-08-17 DIAGNOSIS — Z0000 Encounter for general adult medical examination without abnormal findings: Secondary | ICD-10-CM | POA: Diagnosis not present

## 2017-08-17 DIAGNOSIS — R972 Elevated prostate specific antigen [PSA]: Secondary | ICD-10-CM | POA: Diagnosis not present

## 2017-08-17 DIAGNOSIS — M549 Dorsalgia, unspecified: Secondary | ICD-10-CM | POA: Diagnosis not present

## 2017-08-17 DIAGNOSIS — D649 Anemia, unspecified: Secondary | ICD-10-CM | POA: Diagnosis not present

## 2017-08-17 DIAGNOSIS — I2510 Atherosclerotic heart disease of native coronary artery without angina pectoris: Secondary | ICD-10-CM | POA: Diagnosis not present

## 2017-08-17 DIAGNOSIS — Z23 Encounter for immunization: Secondary | ICD-10-CM | POA: Diagnosis not present

## 2017-08-17 DIAGNOSIS — I129 Hypertensive chronic kidney disease with stage 1 through stage 4 chronic kidney disease, or unspecified chronic kidney disease: Secondary | ICD-10-CM | POA: Diagnosis not present

## 2017-08-17 DIAGNOSIS — E1122 Type 2 diabetes mellitus with diabetic chronic kidney disease: Secondary | ICD-10-CM | POA: Diagnosis not present

## 2017-08-17 DIAGNOSIS — N400 Enlarged prostate without lower urinary tract symptoms: Secondary | ICD-10-CM | POA: Diagnosis not present

## 2017-08-17 DIAGNOSIS — E785 Hyperlipidemia, unspecified: Secondary | ICD-10-CM | POA: Diagnosis not present

## 2017-09-04 ENCOUNTER — Encounter (HOSPITAL_COMMUNITY): Payer: Self-pay | Admitting: Emergency Medicine

## 2017-09-04 ENCOUNTER — Emergency Department (HOSPITAL_COMMUNITY): Payer: Medicare Other

## 2017-09-04 ENCOUNTER — Inpatient Hospital Stay (HOSPITAL_COMMUNITY)
Admission: EM | Admit: 2017-09-04 | Discharge: 2017-09-06 | DRG: 312 | Disposition: A | Discharge status: Home / Self Care | Payer: Medicare Other | Attending: Internal Medicine | Admitting: Internal Medicine

## 2017-09-04 ENCOUNTER — Other Ambulatory Visit: Payer: Self-pay

## 2017-09-04 DIAGNOSIS — R42 Dizziness and giddiness: Secondary | ICD-10-CM | POA: Diagnosis not present

## 2017-09-04 DIAGNOSIS — G4730 Sleep apnea, unspecified: Secondary | ICD-10-CM

## 2017-09-04 DIAGNOSIS — I2510 Atherosclerotic heart disease of native coronary artery without angina pectoris: Secondary | ICD-10-CM | POA: Diagnosis present

## 2017-09-04 DIAGNOSIS — R531 Weakness: Secondary | ICD-10-CM | POA: Diagnosis not present

## 2017-09-04 DIAGNOSIS — D649 Anemia, unspecified: Secondary | ICD-10-CM | POA: Diagnosis present

## 2017-09-04 DIAGNOSIS — E1165 Type 2 diabetes mellitus with hyperglycemia: Secondary | ICD-10-CM | POA: Diagnosis not present

## 2017-09-04 DIAGNOSIS — I130 Hypertensive heart and chronic kidney disease with heart failure and stage 1 through stage 4 chronic kidney disease, or unspecified chronic kidney disease: Secondary | ICD-10-CM

## 2017-09-04 DIAGNOSIS — R404 Transient alteration of awareness: Secondary | ICD-10-CM | POA: Diagnosis not present

## 2017-09-04 DIAGNOSIS — Z9841 Cataract extraction status, right eye: Secondary | ICD-10-CM

## 2017-09-04 DIAGNOSIS — N184 Chronic kidney disease, stage 4 (severe): Secondary | ICD-10-CM | POA: Diagnosis present

## 2017-09-04 DIAGNOSIS — G4739 Other sleep apnea: Secondary | ICD-10-CM

## 2017-09-04 DIAGNOSIS — D631 Anemia in chronic kidney disease: Secondary | ICD-10-CM | POA: Diagnosis present

## 2017-09-04 DIAGNOSIS — Z9842 Cataract extraction status, left eye: Secondary | ICD-10-CM

## 2017-09-04 DIAGNOSIS — E7800 Pure hypercholesterolemia, unspecified: Secondary | ICD-10-CM | POA: Diagnosis present

## 2017-09-04 DIAGNOSIS — E860 Dehydration: Secondary | ICD-10-CM | POA: Diagnosis present

## 2017-09-04 DIAGNOSIS — G4731 Primary central sleep apnea: Secondary | ICD-10-CM

## 2017-09-04 DIAGNOSIS — N183 Chronic kidney disease, stage 3 (moderate): Secondary | ICD-10-CM

## 2017-09-04 DIAGNOSIS — Z7982 Long term (current) use of aspirin: Secondary | ICD-10-CM

## 2017-09-04 DIAGNOSIS — R55 Syncope and collapse: Secondary | ICD-10-CM | POA: Diagnosis not present

## 2017-09-04 DIAGNOSIS — H9190 Unspecified hearing loss, unspecified ear: Secondary | ICD-10-CM | POA: Diagnosis present

## 2017-09-04 DIAGNOSIS — I10 Essential (primary) hypertension: Secondary | ICD-10-CM

## 2017-09-04 DIAGNOSIS — I951 Orthostatic hypotension: Principal | ICD-10-CM | POA: Diagnosis present

## 2017-09-04 DIAGNOSIS — R739 Hyperglycemia, unspecified: Secondary | ICD-10-CM | POA: Diagnosis present

## 2017-09-04 DIAGNOSIS — I129 Hypertensive chronic kidney disease with stage 1 through stage 4 chronic kidney disease, or unspecified chronic kidney disease: Secondary | ICD-10-CM | POA: Diagnosis present

## 2017-09-04 DIAGNOSIS — E1122 Type 2 diabetes mellitus with diabetic chronic kidney disease: Secondary | ICD-10-CM | POA: Diagnosis present

## 2017-09-04 DIAGNOSIS — R32 Unspecified urinary incontinence: Secondary | ICD-10-CM | POA: Diagnosis present

## 2017-09-04 DIAGNOSIS — Z961 Presence of intraocular lens: Secondary | ICD-10-CM | POA: Diagnosis present

## 2017-09-04 DIAGNOSIS — Z955 Presence of coronary angioplasty implant and graft: Secondary | ICD-10-CM

## 2017-09-04 HISTORY — DX: Sleep apnea, unspecified: G47.30

## 2017-09-04 HISTORY — DX: Syncope and collapse: R55

## 2017-09-04 LAB — CBC
HCT: 30.7 % — ABNORMAL LOW (ref 39.0–52.0)
Hemoglobin: 10.6 g/dL — ABNORMAL LOW (ref 13.0–17.0)
MCH: 31.9 pg (ref 26.0–34.0)
MCHC: 34.5 g/dL (ref 30.0–36.0)
MCV: 92.5 fL (ref 78.0–100.0)
PLATELETS: 151 10*3/uL (ref 150–400)
RBC: 3.32 MIL/uL — ABNORMAL LOW (ref 4.22–5.81)
RDW: 12.6 % (ref 11.5–15.5)
WBC: 9.5 10*3/uL (ref 4.0–10.5)

## 2017-09-04 LAB — URINALYSIS, ROUTINE W REFLEX MICROSCOPIC
Bilirubin Urine: NEGATIVE
GLUCOSE, UA: NEGATIVE mg/dL
Hgb urine dipstick: NEGATIVE
Ketones, ur: NEGATIVE mg/dL
Nitrite: NEGATIVE
PH: 5 (ref 5.0–8.0)
Protein, ur: NEGATIVE mg/dL
Specific Gravity, Urine: 1.017 (ref 1.005–1.030)

## 2017-09-04 LAB — FERRITIN: Ferritin: 43 ng/mL (ref 24–336)

## 2017-09-04 LAB — BASIC METABOLIC PANEL
Anion gap: 9 (ref 5–15)
BUN: 36 mg/dL — ABNORMAL HIGH (ref 6–20)
CALCIUM: 8.7 mg/dL — AB (ref 8.9–10.3)
CO2: 22 mmol/L (ref 22–32)
CREATININE: 2.01 mg/dL — AB (ref 0.61–1.24)
Chloride: 107 mmol/L (ref 101–111)
GFR, EST AFRICAN AMERICAN: 32 mL/min — AB (ref >60)
GFR, EST NON AFRICAN AMERICAN: 28 mL/min — AB (ref >60)
GLUCOSE: 168 mg/dL — AB (ref 65–99)
Potassium: 4.4 mmol/L (ref 3.5–5.1)
Sodium: 138 mmol/L (ref 135–145)

## 2017-09-04 LAB — I-STAT TROPONIN, ED: Troponin i, poc: 0 ng/mL (ref 0.00–0.08)

## 2017-09-04 LAB — IRON AND TIBC
Iron: 61 ug/dL (ref 45–182)
SATURATION RATIOS: 20 % (ref 17.9–39.5)
TIBC: 309 ug/dL (ref 250–450)
UIBC: 248 ug/dL

## 2017-09-04 LAB — TROPONIN I: Troponin I: <0.03 ng/mL (ref <0.03)

## 2017-09-04 LAB — HEMOGLOBIN A1C
HEMOGLOBIN A1C: 5.4 % (ref 4.8–5.6)
Mean Plasma Glucose: 108.28 mg/dL

## 2017-09-04 LAB — RETICULOCYTES
RBC.: 3.29 MIL/uL — AB (ref 4.22–5.81)
RETIC CT PCT: 0.7 % (ref 0.4–3.1)
Retic Count, Absolute: 23 10*3/uL (ref 19.0–186.0)

## 2017-09-04 LAB — VITAMIN B12: Vitamin B-12: 1446 pg/mL — ABNORMAL HIGH (ref 180–914)

## 2017-09-04 LAB — FOLATE: Folate: 33 ng/mL (ref >5.9)

## 2017-09-04 MED ORDER — ASPIRIN EC 81 MG PO TBEC
81.0000 mg | DELAYED_RELEASE_TABLET | Freq: Every day | ORAL | Status: DC
  Administered 2017-09-05 – 2017-09-06 (×2): 81 mg via ORAL
  Filled 2017-09-04 (×2): qty 1

## 2017-09-04 MED ORDER — SODIUM CHLORIDE 0.9 % IV SOLN
INTRAVENOUS | Status: AC
  Administered 2017-09-04 – 2017-09-05 (×2): via INTRAVENOUS
  Administered 2017-09-05: 75 mL/h via INTRAVENOUS

## 2017-09-04 MED ORDER — TEMAZEPAM 15 MG PO CAPS: 15.0000 mg | ORAL_CAPSULE | Freq: Every evening | ORAL | Status: DC | PRN

## 2017-09-04 MED ORDER — SODIUM CHLORIDE 0.9 % IV BOLUS (SEPSIS)
500.0000 mL | Freq: Once | INTRAVENOUS | Status: AC
  Administered 2017-09-04: 500 mL via INTRAVENOUS

## 2017-09-04 MED ORDER — INSULIN ASPART 100 UNIT/ML ~~LOC~~ SOLN
0.0000 [IU] | Freq: Three times a day (TID) | SUBCUTANEOUS | Status: DC
  Administered 2017-09-06: 1 [IU] via SUBCUTANEOUS

## 2017-09-04 MED ORDER — ENOXAPARIN SODIUM 40 MG/0.4ML ~~LOC~~ SOLN
40.0000 mg | SUBCUTANEOUS | Status: DC
  Administered 2017-09-04: 40 mg via SUBCUTANEOUS
  Filled 2017-09-04: qty 0.4

## 2017-09-04 MED ORDER — PRAVASTATIN SODIUM 40 MG PO TABS
80.0000 mg | ORAL_TABLET | Freq: Every day | ORAL | Status: DC
  Administered 2017-09-05 – 2017-09-06 (×2): 80 mg via ORAL
  Filled 2017-09-04 (×2): qty 2

## 2017-09-04 MED ORDER — ALFUZOSIN HCL ER 10 MG PO TB24
10.0000 mg | ORAL_TABLET | Freq: Two times a day (BID) | ORAL | Status: DC
  Administered 2017-09-04 – 2017-09-06 (×4): 10 mg via ORAL
  Filled 2017-09-04 (×6): qty 1

## 2017-09-04 NOTE — H&P (Signed)
History and Physical    Calvin Gray DXI:[id number] DOB: 1926-09-10 DOA: 09/04/2017  PCP: [facility] Patient coming from: home  Chief Complaint: syncope  HPI: Calvin Gray is a delightful 83 y.o. male with medical history significant for hypertension, high cholesterol, heart appearing, diabetes diet controlled, coronary artery disease status post PTCA and stenting 2013 presents to the emergency Department chief complaint of syncope. Initial evaluation reveals prostatic hypotension. [facility] are asked to admit.  Information is obtained from the patient and his wife who is at the bedside. They were at church this morning. Patient states he got up to go walk to the family school he felt dizzy. He sat on the floor. Wife was watching from a distance when she did not see him walking she went to investigate and found him on the floor. She started chest compressions and mouth-to-mouth resuscitation. She noted that during chest compressions and mouth-to-mouth he "gurgled and threw up a little bit". He regained consciousness spontaneously. He does reports incontinence of bladder and bowel. Patient denies headache visual disturbances chest pain palpitation shortness of breath. He denies any abdominal pain lower extremity edema or orthopnea. He denies dysuria hematuria but does endorse some frequency during the night. He denies diarrhea constipation melena or bright red blood per rectum. He denies fever chills recent travel or sick contacts. EMS was called and reportedly systolic blood pressure was 120 sitting and dropped to 102 standing  ED Course: Emergency department he is afebrile he has a blood pressure on the low end of normal he not tachycardia no tachypnea not hypoxic. He is provided with 500 mL of normal saline  Review of Systems: As per HPI otherwise all other systems reviewed and are negative.   Ambulatory Status: Ambulates independently is independent with ADLs  Past  Medical History:  Diagnosis Date  . Bradycardia, sinus 02/08/12  . Exertional dyspnea   . High cholesterol   . HOH (hard of hearing)   . Hypertension   . Sleep apnea   . Type II diabetes mellitus ([place])    "keeps it controlled w/diet and exercise"    Past Surgical History:  Procedure Laterality Date  . CATARACT EXTRACTION W/ INTRAOCULAR LENS  IMPLANT, BILATERAL    . CORONARY ANGIOPLASTY WITH STENT PLACEMENT  02/07/12   "1"  . LEFT HEART CATHETERIZATION WITH CORONARY ANGIOGRAM N/A 02/08/2012   Procedure: LEFT HEART CATHETERIZATION WITH CORONARY ANGIOGRAM;  Surgeon: Laverda Page, MD;  Location: [facility] CATH LAB;  Service: Cardiovascular;  Laterality: N/A;    Social History   Socioeconomic History  . Marital status: Married    Spouse name: Not on file  . Number of children: Not on file  . Years of education: Not on file  . Highest education level: Not on file  Social Needs  . Financial resource strain: Not on file  . Food insecurity - worry: Not on file  . Food insecurity - inability: Not on file  . Transportation needs - medical: Not on file  . Transportation needs - non-medical: Not on file  Occupational History  . Occupation: retired  Tobacco Use  . Smoking status: Never Smoker  . Smokeless tobacco: Never Used  Substance and Sexual Activity  . Alcohol use: No  . Drug use: No  . Sexual activity: Not Currently  Other Topics Concern  . Not on file  Social History Narrative  . Not on file    No Known Allergies  No family history on file.  Prior to  Admission medications   Medication Sig Start Date End Date Taking? Authorizing Provider  alfuzosin (UROXATRAL) 10 MG 24 hr tablet Take 10 mg by mouth 2 (two) times daily.   Yes [provider]  aspirin EC 81 MG tablet Take 81 mg by mouth daily.   Yes [provider]  lisinopril-hydrochlorothiazide (PRINZIDE,ZESTORETIC) 10-12.5 MG per tablet Take 1 tablet by mouth daily.   Yes [provider]    Multiple Vitamin (MULTIVITAMIN) tablet Take 1 tablet by mouth daily.   Yes [provider]  pravastatin (PRAVACHOL) 80 MG tablet Take 80 mg by mouth daily.   Yes [provider]  temazepam (RESTORIL) 15 MG capsule Take 15 mg by mouth at bedtime as needed for sleep.    [provider]    Physical Exam: Vitals:   09/04/17 1400 09/04/17 1430 09/04/17 1500 09/04/17 1600  BP: 106/63 (!) 125/57 (!) 113/57 129/64  Pulse: 69 72 74 70  Resp: (!) 24 13 17 15   SpO2: 99% 99% 98% 97%     General:  Appears calm and comfortable slightly pale sitting up in bed in no acute distress Eyes:  PERRL, EOMI, normal lids, iris ENT:  grossly normal hearing, lips & tongue, mucous membranes of his mouth are pink slightly dry Neck:  no LAD, masses or thyromegaly Cardiovascular:  RRR, no Gray/r/g. No LE edema.  Respiratory:  CTA bilaterally, no w/r/r. Normal respiratory effort. Abdomen:  soft, ntnd, positive bowel sounds no guarding or rebounding Skin:  no rash or induration seen on limited exam Musculoskeletal:  grossly normal tone BUE/BLE, good ROM, no bony abnormality Psychiatric:  grossly normal mood and affect, speech fluent and appropriate, AOx3 Neurologic:  CN 2-12 grossly intact, moves all extremities in coordinated fashion, sensation intact speech clear facial symmetry  Labs on Admission: I have personally reviewed following labs and imaging studies  CBC: Recent Labs  Lab 09/04/17 1312  WBC 9.5  HGB 10.6*  HCT 30.7*  MCV 92.5  PLT 426   Basic Metabolic Panel: Recent Labs  Lab 09/04/17 1312  NA 138  K 4.4  CL 107  CO2 22  GLUCOSE 168*  BUN 36*  CREATININE 2.01*  CALCIUM 8.7*   GFR: CrCl cannot be calculated (Unknown ideal weight.). Liver Function Tests: No results for input(s): AST, ALT, ALKPHOS, BILITOT, PROT, ALBUMIN in the last 168 hours. No results for input(s): LIPASE, AMYLASE in the last 168 hours. No results for input(s): AMMONIA in the last 168  hours. Coagulation Profile: No results for input(s): INR, PROTIME in the last 168 hours. Cardiac Enzymes: No results for input(s): CKTOTAL, CKMB, CKMBINDEX, TROPONINI in the last 168 hours. BNP (last 3 results) No results for input(s): PROBNP in the last 8760 hours. HbA1C: No results for input(s): HGBA1C in the last 72 hours. CBG: No results for input(s): GLUCAP in the last 168 hours. Lipid Profile: No results for input(s): CHOL, HDL, LDLCALC, TRIG, CHOLHDL, LDLDIRECT in the last 72 hours. Thyroid Function Tests: No results for input(s): TSH, T4TOTAL, FREET4, T3FREE, THYROIDAB in the last 72 hours. Anemia Panel: No results for input(s): VITAMINB12, FOLATE, FERRITIN, TIBC, IRON, RETICCTPCT in the last 72 hours. Urine analysis: No results found for: COLORURINE, APPEARANCEUR, LABSPEC, PHURINE, GLUCOSEU, HGBUR, BILIRUBINUR, KETONESUR, PROTEINUR, UROBILINOGEN, NITRITE, LEUKOCYTESUR  Creatinine Clearance: CrCl cannot be calculated (Unknown ideal weight.).  Sepsis Labs: @LABRCNTIP (procalcitonin:4,lacticidven:4) )No results found for this or any previous visit (from the past 240 hour(s)).   Radiological Exams on Admission: Dg Chest 2 View  Result Date: 09/04/2017 CLINICAL DATA:  Syncope EXAM: CHEST  2 VIEW COMPARISON:  None. FINDINGS: There is elevation of the right diaphragm. There is no focal parenchymal opacity. There is no pleural effusion or pneumothorax. The heart and mediastinal contours are unremarkable. The osseous structures are unremarkable. IMPRESSION: No active cardiopulmonary disease. Electronically Signed   By: Kathreen Devoid   On: 09/04/2017 15:22    EKG: Normal sinus rhythm Incomplete right bundle branch block Septal infarct , age undetermined Abnormal ECG  Assessment/Plan Principal Problem:   Syncope and collapse Active Problems:   CAD (coronary artery disease), native coronary artery   Complex sleep apnea syndrome   Hypertension   Syncope   Anemia    Hyperglycemia   #1. Syncope. Likely related to orthostatic hypotension in the setting of lisinopril, hydrochlorothiazide,and alfuzosin. No signs of infectious process. No metabolic derangement.E KG without acute changes. Initial troponin negative. Patient orthostatic at the scene. He received 500 mL normal saline in the emergency department. He remained orthostatic at the time of admission. In addition he reported incontinence of bowel and bladder during the episode. -Admit to telemetry -Gentle IV fluids -Recheck orthostatic vital signs -Hold medications until her blood pressure -Cycle troponin -EEG  #2. Hyperglycemia. Patient with a medical history of diabetes diet controlled. Serum glucose 168 on admission. -Obtain a hemoglobin A1c -Sliding scale for optimal control -Carb modified diet  #3. Hypertension. See #1. Home medications include hydrochlorothiazide, lisinopril. Patient is orthostatic on admission -Hold lisinopril -Hold hydrochlorothiazide -Monitor  #4. History of urinary frequency especially during night. Home medications include alfuzosin. Patient was incontinent of urine during the episode. Bladder scan in the emergency department revealed 250 mL urine post fluid bolus. Patient has attempted to 4 twice and unable to -We will continue alfuzosin -When necessary in and out cath -Gentle IV fluids -Monitor urine output -Post void bladder scan  #5. Anemia. Hemoglobin 10.8 on admission. No signs of active bleeding. -FOBT -Anemia panel -Continue follow-up with PCP  #6. CAD. No chest pain. EKG as noted above. Initial troponin negative -Monitor on telemetry -Continue aspirin -Continue statin -cycle troponin   DVT prophylaxis: scd  Code Status: full  Family Communication: wife and daughter at bedside  Disposition Plan: home  Consults called: none  Admission status: obs    Calvin Carrel M MD [facility]  If 7PM-7AM, please contact  night-coverage [email or web address] Password [facility]  09/04/2017, 4:54 PM

## 2017-09-04 NOTE — ED Triage Notes (Signed)
Pt arrived via EMS from church where his wife found him on the floor unconscious. Pt states he remember feeling dizzy and sat himself on the floor. Denies any pain. EMS reports positive ortho changes. BP of 120 sit and 102 standing.

## 2017-09-04 NOTE — ED Notes (Signed)
Pt given water per Margaret(RN)

## 2017-09-04 NOTE — ED Notes (Signed)
Patient transported to X-ray 

## 2017-09-04 NOTE — Plan of Care (Signed)
  Progressing Activity: Risk for activity intolerance will decrease 09/04/2017 2309 - Progressing by Ruben Im, RN Elimination: Will not experience complications related to bowel motility 09/04/2017 2309 - Progressing by Ruben Im, RN Will not experience complications related to urinary retention 09/04/2017 2309 - Progressing by Ruben Im, RN Pain Managment: General experience of comfort will improve 09/04/2017 2309 - Progressing by Ruben Im, RN Safety: Ability to remain free from injury will improve 09/04/2017 2309 - Progressing by Ruben Im, RN Skin Integrity: Risk for impaired skin integrity will decrease 09/04/2017 2309 - Progressing by Ruben Im, RN

## 2017-09-04 NOTE — ED Provider Notes (Signed)
[place] EMERGENCY DEPARTMENT Provider Note   CSN: [id number] Arrival date & time: 09/04/17  1303     History   Chief Complaint Chief Complaint  Patient presents with  . Loss of Consciousness    HPI Calvin Gray is a 83 y.o. male.  The history is provided by the patient and the spouse. No language interpreter was used.  Loss of Consciousness      Calvin Gray is a 83 y.o. male who presents to the Emergency Department complaining of syncope.  Presents via EMS for evaluation of syncope.  He was at church earlier today when he began to feel dizzy and lightheaded.  He started to sit down in the hallway when he lost consciousness.  He does not think he fell.  His wife found him unresponsive laying on his back between 2 rooms.  She began chest compressions and rescue breaths.  After an unknown period of time he began to breathe spontaneously and moan and she rolled him on his side.  He did vomit twice.  He was able to get himself off the floor and walk to the bathroom to vomit.  Emesis was yellow in color.  He denies any chest pain, shortness of breath, fevers, abdominal pain, black or bloody stools.  He does have a history of hypertension.  He endorses feeling fatigued lately with dyspnea on exertion.  Overall he feels currently at his baseline.  Past Medical History:  Diagnosis Date  . Bradycardia, sinus 02/08/12  . Exertional dyspnea   . High cholesterol   . HOH (hard of hearing)   . Hypertension   . Sleep apnea   . Type II diabetes mellitus ([place])    "keeps it controlled w/diet and exercise"    Patient Active Problem List   Diagnosis Date Noted  . Syncope and collapse 09/04/2017  . Syncope 09/04/2017  . Hypertension   . Sleep apnea   . Insomnia 12/01/2016  . Exertional dyspnea 12/01/2016  . Fatigue 11/26/2015  . Complex sleep apnea syndrome 06/09/2012  . CAD (coronary artery disease), native coronary artery 02/09/2012  . S/P PTCA (percutaneous  transluminal coronary angioplasty) 02/09/2012    Past Surgical History:  Procedure Laterality Date  . CATARACT EXTRACTION W/ INTRAOCULAR LENS  IMPLANT, BILATERAL    . CORONARY ANGIOPLASTY WITH STENT PLACEMENT  02/07/12   "1"  . LEFT HEART CATHETERIZATION WITH CORONARY ANGIOGRAM N/A 02/08/2012   Procedure: LEFT HEART CATHETERIZATION WITH CORONARY ANGIOGRAM;  Surgeon: Laverda Page, MD;  Location: [facility] CATH LAB;  Service: Cardiovascular;  Laterality: N/A;       Home Medications    Prior to Admission medications   Medication Sig Start Date End Date Taking? Authorizing Provider  alfuzosin (UROXATRAL) 10 MG 24 hr tablet Take 10 mg by mouth 2 (two) times daily.   Yes [provider]  aspirin EC 81 MG tablet Take 81 mg by mouth daily.   Yes [provider]  lisinopril-hydrochlorothiazide (PRINZIDE,ZESTORETIC) 10-12.5 MG per tablet Take 1 tablet by mouth daily.   Yes [provider]  Multiple Vitamin (MULTIVITAMIN) tablet Take 1 tablet by mouth daily.   Yes [provider]  pravastatin (PRAVACHOL) 80 MG tablet Take 80 mg by mouth daily.   Yes [provider]  temazepam (RESTORIL) 15 MG capsule Take 15 mg by mouth at bedtime as needed for sleep.    [provider]    Family History No family history on file.  Social History Social History  Tobacco Use  . Smoking status: Never Smoker  . Smokeless tobacco: Never Used  Substance Use Topics  . Alcohol use: No  . Drug use: No     Allergies   Patient has no known allergies.   Review of Systems Review of Systems  Cardiovascular: Positive for syncope.  All other systems reviewed and are negative.    Physical Exam Updated Vital Signs BP 129/64   Pulse 70   Resp 15   SpO2 97%   Physical Exam  Constitutional: He is oriented to person, place, and time. He appears well-developed and well-nourished.  HENT:  Head: Normocephalic and atraumatic.  Cardiovascular: Normal rate  and regular rhythm.  No murmur heard. Pulmonary/Chest: Effort normal and breath sounds normal. No respiratory distress.  Abdominal: Soft. There is no tenderness. There is no rebound and no guarding.  Musculoskeletal: He exhibits no edema or tenderness.  Neurological: He is alert and oriented to person, place, and time.  Skin: Skin is warm and dry.  Psychiatric: He has a normal mood and affect. His behavior is normal.  Nursing note and vitals reviewed.    ED Treatments / Results  Labs (all labs ordered are listed, but only abnormal results are displayed) Labs Reviewed  BASIC METABOLIC PANEL - Abnormal; Notable for the following components:      Result Value   Glucose, Bld 168 (*)    BUN 36 (*)    Creatinine, Ser 2.01 (*)    Calcium 8.7 (*)    GFR calc non Af Amer 28 (*)    GFR calc Af Amer 32 (*)    All other components within normal limits  CBC - Abnormal; Notable for the following components:   RBC 3.32 (*)    Hemoglobin 10.6 (*)    HCT 30.7 (*)    All other components within normal limits  URINALYSIS, ROUTINE W REFLEX MICROSCOPIC  CBG MONITORING, ED  I-STAT TROPONIN, ED    EKG  EKG Interpretation  Date/Time:  Sunday September 04 2017 13:17:08 EST Ventricular Rate:  63 PR Interval:  194 QRS Duration: 116 QT Interval:  436 QTC Calculation: 446 R Axis:   1 Text Interpretation:  Normal sinus rhythm Incomplete right bundle branch block Septal infarct , age undetermined Abnormal ECG Confirmed by Quintella Reichert [phone]) on 09/04/2017 1:32:34 PM       Radiology Dg Chest 2 View  Result Date: 09/04/2017 CLINICAL DATA:  Syncope EXAM: CHEST  2 VIEW COMPARISON:  None. FINDINGS: There is elevation of the right diaphragm. There is no focal parenchymal opacity. There is no pleural effusion or pneumothorax. The heart and mediastinal contours are unremarkable. The osseous structures are unremarkable. IMPRESSION: No active cardiopulmonary disease. Electronically Signed   By: Kathreen Devoid   On: 09/04/2017 15:22    Procedures Procedures (including critical care time) EMERGENCY DEPARTMENT ULTRASOUND  Study: Limited Retroperitoneal Ultrasound of the Abdominal Aorta.  INDICATIONS:Age>55 Multiple views of the abdominal aorta were obtained in real-time from the diaphragmatic hiatus to the aortic bifurcation in transverse planes with a multi-frequency probe.  PERFORMED BY: Myself IMAGES ARCHIVED?: Yes LIMITATIONS:  Bowel gas INTERPRETATION:  No abdominal aortic aneurysm there is limited evaluation of the distal aorta, unable to evaluate through the bifurcation.    Medications Ordered in ED Medications  alfuzosin (UROXATRAL) 24 hr tablet 10 mg (not administered)  aspirin EC tablet 81 mg (not administered)  pravastatin (PRAVACHOL) tablet 80 mg (not administered)  enoxaparin (LOVENOX) injection 40 mg (not administered)  0.9 %  sodium chloride infusion ( Intravenous New Bag/Given 09/04/17 1634)  sodium chloride 0.9 % bolus 500 mL (0 mLs Intravenous Stopped 09/04/17 1511)     Initial Impression / Assessment and Plan / ED Course  I have reviewed the triage vital signs and the nursing notes.  Pertinent labs & imaging results that were available during my care of the patient were reviewed by me and considered in my medical decision making (see chart for details).     Patient here for evaluation following a syncopal event while at church today.  His wife did administer CPR but there is no evidence of trauma to his chest and he has no chest wall tenderness.  Labs demonstrate renal insufficiency, slightly worse compared to priors as well as mild anemia.  He was recently evaluated at his PCPs office for anemia but his recent hemoglobin is unknown.  He denies any hematochezia or melena.  Medicine consulted for observation admission for syncope.  Final Clinical Impressions(s) / ED Diagnoses   Final diagnoses:  Syncope and collapse    ED Discharge Orders    None         Quintella Reichert, MD 09/04/17 [phone]

## 2017-09-05 ENCOUNTER — Observation Stay (HOSPITAL_COMMUNITY): Payer: Medicare Other

## 2017-09-05 DIAGNOSIS — N184 Chronic kidney disease, stage 4 (severe): Secondary | ICD-10-CM | POA: Diagnosis not present

## 2017-09-05 DIAGNOSIS — G4731 Primary central sleep apnea: Secondary | ICD-10-CM | POA: Diagnosis not present

## 2017-09-05 DIAGNOSIS — I5030 Unspecified diastolic (congestive) heart failure: Secondary | ICD-10-CM

## 2017-09-05 DIAGNOSIS — I129 Hypertensive chronic kidney disease with stage 1 through stage 4 chronic kidney disease, or unspecified chronic kidney disease: Secondary | ICD-10-CM | POA: Diagnosis present

## 2017-09-05 DIAGNOSIS — G4730 Sleep apnea, unspecified: Secondary | ICD-10-CM

## 2017-09-05 DIAGNOSIS — D631 Anemia in chronic kidney disease: Secondary | ICD-10-CM

## 2017-09-05 DIAGNOSIS — Z955 Presence of coronary angioplasty implant and graft: Secondary | ICD-10-CM | POA: Diagnosis not present

## 2017-09-05 DIAGNOSIS — E860 Dehydration: Secondary | ICD-10-CM | POA: Diagnosis present

## 2017-09-05 DIAGNOSIS — Z961 Presence of intraocular lens: Secondary | ICD-10-CM | POA: Diagnosis present

## 2017-09-05 DIAGNOSIS — E7800 Pure hypercholesterolemia, unspecified: Secondary | ICD-10-CM | POA: Diagnosis present

## 2017-09-05 DIAGNOSIS — R55 Syncope and collapse: Secondary | ICD-10-CM | POA: Diagnosis not present

## 2017-09-05 DIAGNOSIS — I2510 Atherosclerotic heart disease of native coronary artery without angina pectoris: Secondary | ICD-10-CM | POA: Diagnosis present

## 2017-09-05 DIAGNOSIS — I10 Essential (primary) hypertension: Secondary | ICD-10-CM | POA: Diagnosis not present

## 2017-09-05 DIAGNOSIS — N183 Chronic kidney disease, stage 3 unspecified: Secondary | ICD-10-CM

## 2017-09-05 DIAGNOSIS — Z9841 Cataract extraction status, right eye: Secondary | ICD-10-CM | POA: Diagnosis not present

## 2017-09-05 DIAGNOSIS — R739 Hyperglycemia, unspecified: Secondary | ICD-10-CM | POA: Diagnosis not present

## 2017-09-05 DIAGNOSIS — R32 Unspecified urinary incontinence: Secondary | ICD-10-CM | POA: Diagnosis present

## 2017-09-05 DIAGNOSIS — I130 Hypertensive heart and chronic kidney disease with heart failure and stage 1 through stage 4 chronic kidney disease, or unspecified chronic kidney disease: Secondary | ICD-10-CM

## 2017-09-05 DIAGNOSIS — E1122 Type 2 diabetes mellitus with diabetic chronic kidney disease: Secondary | ICD-10-CM | POA: Diagnosis present

## 2017-09-05 DIAGNOSIS — H9190 Unspecified hearing loss, unspecified ear: Secondary | ICD-10-CM | POA: Diagnosis present

## 2017-09-05 DIAGNOSIS — Z7982 Long term (current) use of aspirin: Secondary | ICD-10-CM | POA: Diagnosis not present

## 2017-09-05 DIAGNOSIS — E1165 Type 2 diabetes mellitus with hyperglycemia: Secondary | ICD-10-CM | POA: Diagnosis present

## 2017-09-05 DIAGNOSIS — I951 Orthostatic hypotension: Secondary | ICD-10-CM | POA: Diagnosis present

## 2017-09-05 DIAGNOSIS — Z9842 Cataract extraction status, left eye: Secondary | ICD-10-CM | POA: Diagnosis not present

## 2017-09-05 DIAGNOSIS — R001 Bradycardia, unspecified: Secondary | ICD-10-CM | POA: Diagnosis not present

## 2017-09-05 LAB — CBC
HCT: 28.4 % — ABNORMAL LOW (ref 39.0–52.0)
Hemoglobin: 9.8 g/dL — ABNORMAL LOW (ref 13.0–17.0)
MCH: 32.5 pg (ref 26.0–34.0)
MCHC: 34.5 g/dL (ref 30.0–36.0)
MCV: 94 fL (ref 78.0–100.0)
PLATELETS: 133 10*3/uL — AB (ref 150–400)
RBC: 3.02 MIL/uL — AB (ref 4.22–5.81)
RDW: 12.9 % (ref 11.5–15.5)
WBC: 6.7 10*3/uL (ref 4.0–10.5)

## 2017-09-05 LAB — BASIC METABOLIC PANEL
Anion gap: 5 (ref 5–15)
BUN: 34 mg/dL — ABNORMAL HIGH (ref 6–20)
CHLORIDE: 111 mmol/L (ref 101–111)
CO2: 23 mmol/L (ref 22–32)
CREATININE: 1.95 mg/dL — AB (ref 0.61–1.24)
Calcium: 8.5 mg/dL — ABNORMAL LOW (ref 8.9–10.3)
GFR, EST AFRICAN AMERICAN: 33 mL/min — AB (ref >60)
GFR, EST NON AFRICAN AMERICAN: 29 mL/min — AB (ref >60)
Glucose, Bld: 89 mg/dL (ref 65–99)
POTASSIUM: 4.2 mmol/L (ref 3.5–5.1)
SODIUM: 139 mmol/L (ref 135–145)

## 2017-09-05 LAB — ECHOCARDIOGRAM COMPLETE
AO mean calculated velocity dopler: 110 cm/s
AOVTI: 37.1 cm
AV Area VTI index: 1.1 cm2/m2
AV Area VTI: 2.31 cm2
AV Peak grad: 8 mmHg
AV VEL mean LVOT/AV: 0.69
AVAREAMEANV: 2.17 cm2
AVAREAMEANVIN: 1.15 cm2/m2
AVG: 5 mmHg
AVLVOTPG: 4 mmHg
AVPKVEL: 144 cm/s
Ao pk vel: 0.74 m/s
CHL CUP AV PEAK INDEX: 1.23
CHL CUP AV VEL: 2.07
E decel time: 285 msec
E/e' ratio: 12.37
FS: 35 % (ref 28–44)
HEIGHTINCHES: 70 in
HEIGHTINCHES: 70 in
IV/PV OW: 1.03
LA diam end sys: 36 mm
LA diam index: 1.91 cm/m2
LA vol: 43 mL
LASIZE: 36 mm
LAVOLA4C: 39.4 mL
LAVOLIN: 22.9 mL/m2
LV E/e' medial: 12.37
LV E/e'average: 12.37
LV e' LATERAL: 7.14 cm/s
LVOT SV: 77 mL
LVOT VTI: 24.5 cm
LVOT area: 3.14 cm2
LVOT diameter: 20 mm
LVOTPV: 106 cm/s
LVOTVTI: 0.66 cm
Lateral S' vel: 13.3 cm/s
MV Dec: 285
MV Peak grad: 3 mmHg
MV pk E vel: 88.3 m/s
MVPKAVEL: 108 m/s
PW: 11.2 mm — AB (ref 0.6–1.1)
TAPSE: 28.5 mm
TDI e' lateral: 7.14
TDI e' medial: 6.73
Valve area index: 1.1
Valve area: 2.07 cm2
WEIGHTICAEL: 2513.6 [oz_av]
Weight: 2513.6 oz

## 2017-09-05 LAB — GLUCOSE, CAPILLARY
GLUCOSE-CAPILLARY: 92 mg/dL (ref 65–99)
GLUCOSE-CAPILLARY: 105 mg/dL — AB (ref 65–99)
Glucose-Capillary: 83 mg/dL (ref 65–99)
Glucose-Capillary: 110 mg/dL — ABNORMAL HIGH (ref 65–99)

## 2017-09-05 MED ORDER — ENOXAPARIN SODIUM 30 MG/0.3ML ~~LOC~~ SOLN
30.0000 mg | SUBCUTANEOUS | Status: DC
  Administered 2017-09-05: 30 mg via SUBCUTANEOUS
  Filled 2017-09-05: qty 0.3

## 2017-09-05 MED ORDER — SODIUM CHLORIDE 0.9 % IV BOLUS (SEPSIS)
500.0000 mL | Freq: Once | INTRAVENOUS | Status: AC
  Administered 2017-09-05: 500 mL via INTRAVENOUS

## 2017-09-05 MED ORDER — TECHNETIUM TC 99M DIETHYLENETRIAME-PENTAACETIC ACID
30.6000 | Freq: Once | INTRAVENOUS | Status: AC | PRN
  Administered 2017-09-05: 30.6 via RESPIRATORY_TRACT

## 2017-09-05 MED ORDER — TECHNETIUM TO 99M ALBUMIN AGGREGATED
4.2700 | Freq: Once | INTRAVENOUS | Status: AC | PRN
  Administered 2017-09-05: 4.27 via INTRAVENOUS

## 2017-09-05 MED ORDER — SODIUM CHLORIDE 0.9 % IV SOLN: INTRAVENOUS | Status: DC

## 2017-09-05 NOTE — Progress Notes (Signed)
  Echocardiogram 2D Echocardiogram has been performed.  Calvin Gray 09/05/2017, 9:29 AM

## 2017-09-05 NOTE — Progress Notes (Signed)
Nutrition Brief Note  Patient identified on the Malnutrition Screening Tool (MST) Report  Wt Readings from Last 15 Encounters:  09/05/17 157 lb 1.6 oz (71.3 kg)  06/28/17 164 lb 10.9 oz (74.7 kg)  06/23/17 164 lb 7.4 oz (74.6 kg)  06/21/17 164 lb 0.4 oz (74.4 kg)  06/16/17 164 lb 14.5 oz (74.8 kg)  06/14/17 165 lb 5.5 oz (75 kg)  06/09/17 167 lb 5.3 oz (75.9 kg)  06/07/17 167 lb 15.9 oz (76.2 kg)  06/02/17 167 lb 12.3 oz (76.1 kg)  05/31/17 168 lb 14 oz (76.6 kg)  05/26/17 169 lb 1.5 oz (76.7 kg)  05/24/17 167 lb 1.7 oz (75.8 kg)  05/19/17 164 lb 14.5 oz (74.8 kg)  05/17/17 163 lb 12.8 oz (74.3 kg)  05/12/17 166 lb 14.2 oz (75.7 kg)   Calvin Gray is a delightful 83 y.o. male with medical history significant for hypertension, high cholesterol, heart appearing, diabetes diet controlled, coronary artery disease status post PTCA and stenting 2013 presents to the emergency Department chief complaint of syncope. Initial evaluation reveals prostatic hypotension. [facility] are asked to admit.  Pt admitted with syncope secondary to orthostatic hypotension.   Spoke with pt and wife at bedside. Pt is very active and healthy at baseline; per pt, he still chops firewood to heat his basement and recently completed cardiac rehab this fall. He generally eats 6 times per day (Breakfast: cereal with milk, banana, and orange juice, AM snack: coffee and cheese toast, Lunch: meat and two vegetables, Snack: ice cream, Dinner: meat and two vegetables, and HS snack: 3 peanut butter crackers). Pt consumed 100% of breakfast this AM.   Pt reports UBW is around 165#. He denies any weight loss and suspects wt difference is related to different scales. He denies any changes in intake or clothes fitting looser.   Nutrition-Focused physical exam completed. Findings are no fat depletion, mild muscle depletion, and no edema. Mild muscle depletion is likely related to advanced age.   Pt with no further  questions at this time, but was appreciative for visit.  Labs reviewed: CBGS: 92 (inpatinet orders for glycemic control are 0-9 units insulin aspart TID with meals).   Body mass index is 22.54 kg/m. Patient meets criteria for normal weight status based on current BMI.   Current diet order is Heart Healthy/ Carb Modified, patient is consuming approximately 100% of meals at this time. Labs and medications reviewed.   No nutrition interventions warranted at this time. If nutrition issues arise, please consult RD.   Calvin Gray A. Jimmye Norman, RD, LDN, CDE Pager: [phone] After hours Pager: [phone]

## 2017-09-05 NOTE — Progress Notes (Signed)
EEG complete - results pending 

## 2017-09-05 NOTE — Progress Notes (Signed)
Transported via bed to radiology for CT of the head.  Patient updated on plan of care.

## 2017-09-05 NOTE — Progress Notes (Signed)
Patient returned from tests and had 2DEcho, EEG, and CT of Head this morning.  Dr. Alfredia Ferguson to room and updated patient and his wife on his plan of care.  A PT/OT consult and Nuc MED exam of chest ordered to rule out pulmonary embolus.  Patient and wife stated understanding.

## 2017-09-05 NOTE — Progress Notes (Signed)
2nd set of orthostatic vital signs obtained and systolic b/p dropped 19 points between lying and standing.  Patient denied dizziness with all position changes.  Skin remained warm and dry.  Dr. Alfredia Ferguson informed of data via [facility] text page.

## 2017-09-05 NOTE — Plan of Care (Signed)
  Progressing Clinical Measurements: Ability to maintain clinical measurements within normal limits will improve 09/05/2017 1249 - Progressing by Alonna Buckler, RN Diagnostic test results will improve 09/05/2017 1249 - Progressing by Alonna Buckler, RN Cardiovascular complication will be avoided 09/05/2017 1249 - Progressing by Alonna Buckler, RN Activity: Risk for activity intolerance will decrease 09/05/2017 1249 - Progressing by Alonna Buckler, RN Safety: Ability to remain free from injury will improve 09/05/2017 1249 - Progressing by Alonna Buckler, RN

## 2017-09-05 NOTE — Progress Notes (Signed)
PT Cancellation Note  Patient Details Name: Brayn Eckstein MRN: [id number] DOB: [birth year]   Cancelled Treatment:    Reason Eval/Treat Not Completed: Patient not medically ready Pt waiting for test to rule out PE. Will hold until medically appropriate and test results received and will follow up as schedule allows.   Leighton Ruff, PT, DPT  Acute Rehabilitation Services  Pager: [phone]    Rudean Hitt 09/05/2017, 2:35 PM

## 2017-09-05 NOTE — Procedures (Signed)
  [place] A. Merlene Laughter, MD     [email or web address]           HISTORY: This 83 year old male who presents with syncopal episodes.  The study is being done to evaluate for seizure as an etiology.  MEDICATIONS: Scheduled Meds: . alfuzosin  10 mg Oral BID  . aspirin EC  81 mg Oral Daily  . enoxaparin (LOVENOX) injection  30 mg Subcutaneous Q24H  . insulin aspart  0-9 Units Subcutaneous TID WC  . pravastatin  80 mg Oral Daily   Continuous Infusions: PRN Meds:.  Prior to Admission medications   Medication Sig Start Date End Date Taking? Authorizing Provider  alfuzosin (UROXATRAL) 10 MG 24 hr tablet Take 10 mg by mouth 2 (two) times daily.   Yes [provider]  aspirin EC 81 MG tablet Take 81 mg by mouth daily.   Yes [provider]  lisinopril-hydrochlorothiazide (PRINZIDE,ZESTORETIC) 10-12.5 MG per tablet Take 1 tablet by mouth daily.   Yes [provider]  Multiple Vitamin (MULTIVITAMIN) tablet Take 1 tablet by mouth daily.   Yes [provider]  pravastatin (PRAVACHOL) 80 MG tablet Take 80 mg by mouth daily.   Yes [provider]  temazepam (RESTORIL) 15 MG capsule Take 15 mg by mouth at bedtime as needed for sleep.    [provider]      ANALYSIS: A 16 channel recording using standard 10 20 measurements is conducted for 25 minutes.   There is a well-formed posterior dominant rhythm of 9-9.5 hertz which attenuates with eye opening.  There is beta activity observed in the frontal areas.  Awake and drowsy activities are observed.  Photic stimulation is carried out without abnormal changes in the background activity.  There is no focal or lateralized slowing.  There is no epileptiform activity is observed.   IMPRESSION: 1.   This is a normal recording of awake and drowsy states.      Lalaine Overstreet A. Merlene Laughter, M.D.  Diplomate, Tax adviser of Psychiatry and Neurology ( Neurology).

## 2017-09-05 NOTE — Progress Notes (Signed)
PROGRESS NOTE    Calvin Gray  VOJ:[id number] DOB: 03-10-1926 DOA: 09/04/2017 PCP: [facility]   Brief Narrative: Calvin Gray is a 83 y.o. male with medical history significant for hypertension, high cholesterol, heart appearing, diabetes diet controlled, coronary artery disease status post PTCA and stenting 2013 presents to the emergency Department chief complaint of syncope. Initial evaluation reveals orthostatic hypotension. [facility] are asked to admit for Syncope.  According to the patient and wife they were at church this yesterday morning. Patient states he got up to go walk to the family school he felt dizzy. He sat on the floor. Wife was watching from a distance when she did not see him walking she went to investigate and found him on the floor. She started chest compressions and mouth-to-mouth resuscitation. She noted that during chest compressions and mouth-to-mouth he "gurgled and threw up a little bit". He regained consciousness spontaneously. He reported incontinence of bladder and bowel. He is currently being rehydrated and undergoing further workup for Syncope.  Assessment & Plan:   Principal Problem:   Syncope and collapse Active Problems:   CAD (coronary artery disease), native coronary artery   Complex sleep apnea syndrome   Hypertension   Syncope   Anemia   Hyperglycemia   CKD (chronic kidney disease) stage 4, GFR 15-29 ml/min (HCC)  Syncope likely Orthostatic r/o Other Etiologies -Likely related to orthostatic hypotension in the setting of lisinopril, hydrochlorothiazide,and alfuzosin.  -No signs of infectious process. No metabolic derangement.  -EKG without acute changes. Initial troponin negative. Patient orthostatic at the scene.  -He received 500 mL normal saline in the emergency department and will repeat NS bolus today x2.  -He remained orthostatic at the time of admission. In addition he reported incontinence of bowel and bladder  during the episode. -Admitted to Telemetry -Gentle IV fluids with NS at 75 mL/hr decreased to 50 mL/hr -Recheck orthostatic vital signs -Hold medications until her blood pressure normalizes -TED Hose -V/Q Scan done as could not get CTA of Chest due to Renal Fxn; V/Q Scan showed normal Ventilation-Perfusion Study and no evidence of Pulmonary Thromboembolism -Cycle Troponin; Las was <0.03 -EEG was normal recording of awake and drowsy states. -Head CT done and showed Age related volume loss with rather minimal periventricular small vessel disease. No mass, hemorrhage, or acute appearing infarct. Foci of arterial vascular calcification noted. There are foci of paranasal sinus disease. -ECHOCardiogram done and pending read -PT Evaluate and Treat -Continue to Monitor Closely   Hyperglycemia -Patient with a medical history of Diet Controlled Diabetes Mellitus. Serum glucose 168 on admission. -Obtained a Hemoglobin A1c was 5.4 -Sliding scale for optimal control -Carb modified diet -CBG's ranging from 83-105  Hypertension -See Above. Home medications include Hydrochlorothiazide, Lisinopril. Patient was orthostatic on admission -Continue Hold lisinopril and Hydrochlorothiazide -Continue to Monitor and repeat Orthostatics   Hx of Urinary Frequency  -Home medications include Alfuzosin.  -Patient was incontinent of urine during the episode.  -Bladder scan in the emergency department revealed 250 mL urine post fluid bolus.  -We will continue Alfuzosin 10 mg po BID -When necessary in and out cath -Gentle IV fluids as Abve -Monitor urine output -Post void bladder scan  Normocytic Anemia likely of Chronic Kidney Disease -Hemoglobin 10.8 on Admission.  -Hb/Hct now went to 9.8/28.4 -No signs of active bleeding. -Anemia Panel showed Iron Level of 61, UBIC of 248, TIBC of 309, Saturation Ratios of 20, Ferritin of 43, Folate 33.0, Vitamin B12 1446 -FOBT to be collected  -  Continue follow-up  with PCP  CAD -No chest pain. Initial troponin negative and repeat <0.03 -Continue to Monitor on Telemetry -Continue ASA 81 mg po Daily and Pravastatin 80 mg po Daily  -Continue to Monitor   CKD Stage 4 -Last Cr from Recent Outpatient Records revealed Cr of 2.1 (Last Baseline in our system was 1.3-1.4 in 2013) -C/w IVF Rehydration as above -Avoid Nephrotoxic Medications -Repeat CMP in AM   DVT prophylaxis: Enoxaparin 30 mg sq q24h Code Status: FULL CODE Family Communication: Discussed with wife at bedside Disposition Plan: Pending further workup and PT Evaluation   Consultants:   None   Procedures:  EEG ANALYSIS: A 16 channel recording using standard 10 20 measurements is conducted for 25 minutes.   There is a well-formed posterior dominant rhythm of 9-9.5 hertz which attenuates with eye opening.  There is beta activity observed in the frontal areas.  Awake and drowsy activities are observed.  Photic stimulation is carried out without abnormal changes in the background activity.  There is no focal or lateralized slowing.  There is no epileptiform activity is observed.   IMPRESSION: 1.   This is a normal recording of awake and drowsy states.  ECHOCARDIOGRAM Done and Pending Read   Antimicrobials: Anti-infectives (From admission, onward)   None     Subjective: Seen and examined at bedside after his Head CT and states he was ok. No CP or lightheadedness. States he felt a little tired. No other complaints or concerns at this time.   Objective: Vitals:   09/04/17 1940 09/05/17 0022 09/05/17 0449 09/05/17 1250  BP: (!) 113/57 (!) 111/53 109/61 (!) 113/49  Pulse: 77 63 (!) 58 62  Resp: 18 18 18    Temp: 98.6 F (37 C) 98.6 F (37 C) 98.3 F (36.8 C)   TempSrc: Oral Oral Oral   SpO2: 99% 99% 99% 94%  Weight:   71.3 kg (157 lb 1.6 oz)   Height:        Intake/Output Summary (Last 24 hours) at 09/05/2017 1835 Last data filed at 09/05/2017 1745 Gross per 24 hour    Intake 3155 ml  Output 1400 ml  Net 1755 ml   Filed Weights   09/04/17 1800 09/05/17 0449  Weight: 71.3 kg (157 lb 1.6 oz) 71.3 kg (157 lb 1.6 oz)   Examination: Physical Exam:  Constitutional: WN/WD elderly Caucasian male in NAD and appears calm Eyes: Lids and conjunctivae normal, sclerae anicteric  ENMT: External Ears, Nose appear normal. Hard of hearing.  Neck: Appears normal, supple, no cervical masses, normal ROM, no appreciable thyromegaly, no JVD Respiratory: Diminished to auscultation bilaterally, no wheezing, rales, rhonchi or crackles. Normal respiratory effort and patient is not tachypenic. No accessory muscle use.  Cardiovascular: RRR, no murmurs / rubs / gallops. S1 and S2 auscultated. No extremity edema. Abdomen: Soft, non-tender, non-distended. No masses palpated. No appreciable hepatosplenomegaly. Bowel sounds positive x4.  GU: Deferred. Musculoskeletal: No clubbing / cyanosis of digits/nails. No joint deformity upper and lower extremities. Skin: No rashes, lesions, ulcers on a limited skin eval. No induration; Warm and dry.  Neurologic: CN 2-12 grossly intact with no focal deficits.  Romberg sign and cerebellar reflexes not assessed.  Psychiatric: Normal judgment and insight. Alert and oriented x 3. Normal mood and appropriate affect.   Data Reviewed: I have personally reviewed following labs and imaging studies  CBC: Recent Labs  Lab 09/04/17 1312 09/05/17 0603  WBC 9.5 6.7  HGB 10.6* 9.8*  HCT 30.7* 28.4*  MCV 92.5 94.0  PLT 151 660*   Basic Metabolic Panel: Recent Labs  Lab 09/04/17 1312 09/05/17 0603  NA 138 139  K 4.4 4.2  CL 107 111  CO2 22 23  GLUCOSE 168* 89  BUN 36* 34*  CREATININE 2.01* 1.95*  CALCIUM 8.7* 8.5*   GFR: Estimated Creatinine Clearance: 25.9 mL/min (A) (by C-G formula based on SCr of 1.95 mg/dL (H)). Liver Function Tests: No results for input(s): AST, ALT, ALKPHOS, BILITOT, PROT, ALBUMIN in the last 168 hours. No results  for input(s): LIPASE, AMYLASE in the last 168 hours. No results for input(s): AMMONIA in the last 168 hours. Coagulation Profile: No results for input(s): INR, PROTIME in the last 168 hours. Cardiac Enzymes: Recent Labs  Lab 09/04/17 1749  TROPONINI <0.03   BNP (last 3 results) No results for input(s): PROBNP in the last 8760 hours. HbA1C: Recent Labs    09/04/17 1749  HGBA1C 5.4   CBG: Recent Labs  Lab 09/05/17 0728 09/05/17 1158 09/05/17 1725  GLUCAP 92 105* 83   Lipid Profile: No results for input(s): CHOL, HDL, LDLCALC, TRIG, CHOLHDL, LDLDIRECT in the last 72 hours. Thyroid Function Tests: No results for input(s): TSH, T4TOTAL, FREET4, T3FREE, THYROIDAB in the last 72 hours. Anemia Panel: Recent Labs    09/04/17 1749  VITAMINB12 1,446*  FOLATE 33.0  FERRITIN 43  TIBC 309  IRON 61  RETICCTPCT 0.7   Sepsis Labs: No results for input(s): PROCALCITON, LATICACIDVEN in the last 168 hours.  No results found for this or any previous visit (from the past 240 hour(s)).   Radiology Studies: Dg Chest 2 View  Result Date: 09/04/2017 CLINICAL DATA:  Syncope EXAM: CHEST  2 VIEW COMPARISON:  None. FINDINGS: There is elevation of the right diaphragm. There is no focal parenchymal opacity. There is no pleural effusion or pneumothorax. The heart and mediastinal contours are unremarkable. The osseous structures are unremarkable. IMPRESSION: No active cardiopulmonary disease. Electronically Signed   By: Kathreen Devoid   On: 09/04/2017 15:22   Ct Head Wo Contrast  Result Date: 09/05/2017 CLINICAL DATA:  Syncope EXAM: CT HEAD WITHOUT CONTRAST TECHNIQUE: Contiguous axial images were obtained from the base of the skull through the vertex without intravenous contrast. COMPARISON:  None. FINDINGS: Brain: There is age related volume loss. There is no intracranial mass, hemorrhage, extra-axial fluid collection, midline shift. There is slight small vessel disease in the centra semiovale  bilaterally. Elsewhere gray-white compartments appear normal. No evident acute infarct. Vascular: No hyperdense vessel. There are foci of calcification in each carotid siphon. Skull: Bony calvarium appears intact. Sinuses/Orbits: There is opacification in several ethmoid air cells. There is a small retention cyst in the lateral right maxillary antrum. Other visualized paranasal sinuses are clear. Orbits appear symmetric bilaterally. Other: Mastoid air cells are clear. IMPRESSION: Age related volume loss with rather minimal periventricular small vessel disease. No mass, hemorrhage, or acute appearing infarct. Foci of arterial vascular calcification noted. There are foci of paranasal sinus disease. Electronically Signed   By: Lowella Grip III M.D.   On: 09/05/2017 09:28   Nm Pulmonary Perf And Vent  Result Date: 09/05/2017 CLINICAL DATA:  Syncope.  Bradycardia.  Dyspnea on exertion. EXAM: NUCLEAR MEDICINE VENTILATION - PERFUSION LUNG SCAN TECHNIQUE: Ventilation images were obtained in multiple projections using inhaled aerosol Tc-38m DTPA. Perfusion images were obtained in multiple projections after intravenous injection of Tc-59m MAA. RADIOPHARMACEUTICALS:  30.6 mCi Technetium-4m DTPA aerosol inhalation and 4.27 mCi Technetium-64m MAA IV COMPARISON:  Chest radiograph, 09/04/2017 FINDINGS: Ventilation: No focal ventilation defect. Perfusion: No wedge shaped peripheral perfusion defects to suggest acute pulmonary embolism. IMPRESSION: Normal ventilation-perfusion study. No evidence of pulmonary thromboembolism. Electronically Signed   By: Lajean Manes M.D.   On: 09/05/2017 17:07   Scheduled Meds: . alfuzosin  10 mg Oral BID  . aspirin EC  81 mg Oral Daily  . enoxaparin (LOVENOX) injection  30 mg Subcutaneous Q24H  . insulin aspart  0-9 Units Subcutaneous TID WC  . pravastatin  80 mg Oral Daily   Continuous Infusions: . sodium chloride      LOS: 0 days   Kerney Elbe, DO [facility] Pager [phone]  If 7PM-7AM, please contact night-coverage [email or web address] Password [facility] 09/05/2017, 6:35 PM

## 2017-09-05 NOTE — Progress Notes (Signed)
OT Cancellation Note  Patient Details Name: Calvin Gray MRN: [id number] DOB: Aug 14, 1926   Cancelled Treatment:    Reason Eval/Treat Not Completed: Patient not medically ready. Pt awaiting test to rule out PE. Will hold OT eval until medically appropriate. Will check back as able.   Norman Herrlich, MS OTR/L  Pager: [phone]   Norman Herrlich 09/05/2017, 2:49 PM

## 2017-09-05 NOTE — Progress Notes (Signed)
Patient transported via bed for Nuclear Medicine test of chest.  Wife at bedside and updated on plan of care.

## 2017-09-05 NOTE — Progress Notes (Signed)
PT Cancellation Note  Patient Details Name: Calvin Gray MRN: [id number] DOB: 07/25/1926   Cancelled Treatment:    Reason Eval/Treat Not Completed: Patient not medically ready. Per RN, pt waiting for Nuc MED exam of chest to rule out pulmonary embolus; also continues to have significant orthostatic hypotension with standing. Will follow-up for PT evaluation once results are known and medically appropriate as pt has orders for an imminent d/c.  Mabeline Caras, PT, DPT Acute Rehab Services  Pager: [phone] 09/05/2017, 11:43 AM

## 2017-09-06 LAB — CBC WITH DIFFERENTIAL/PLATELET
BASOS PCT: 1 %
Basophils Absolute: 0 10*3/uL (ref 0.0–0.1)
Eosinophils Absolute: 0.3 10*3/uL (ref 0.0–0.7)
Eosinophils Relative: 4 %
HEMATOCRIT: 30.1 % — AB (ref 39.0–52.0)
HEMOGLOBIN: 10.2 g/dL — AB (ref 13.0–17.0)
LYMPHS ABS: 1.6 10*3/uL (ref 0.7–4.0)
LYMPHS PCT: 23 %
MCH: 31.7 pg (ref 26.0–34.0)
MCHC: 33.9 g/dL (ref 30.0–36.0)
MCV: 93.5 fL (ref 78.0–100.0)
MONO ABS: 0.6 10*3/uL (ref 0.1–1.0)
MONOS PCT: 8 %
NEUTROS ABS: 4.5 10*3/uL (ref 1.7–7.7)
NEUTROS PCT: 64 %
Platelets: 143 10*3/uL — ABNORMAL LOW (ref 150–400)
RBC: 3.22 MIL/uL — ABNORMAL LOW (ref 4.22–5.81)
RDW: 12.5 % (ref 11.5–15.5)
WBC: 7.1 10*3/uL (ref 4.0–10.5)

## 2017-09-06 LAB — COMPREHENSIVE METABOLIC PANEL
ALBUMIN: 3.4 g/dL — AB (ref 3.5–5.0)
ALK PHOS: 52 U/L (ref 38–126)
ALT: 15 U/L — ABNORMAL LOW (ref 17–63)
ANION GAP: 6 (ref 5–15)
AST: 23 U/L (ref 15–41)
BILIRUBIN TOTAL: 0.8 mg/dL (ref 0.3–1.2)
BUN: 28 mg/dL — AB (ref 6–20)
CALCIUM: 8.5 mg/dL — AB (ref 8.9–10.3)
CO2: 22 mmol/L (ref 22–32)
Chloride: 113 mmol/L — ABNORMAL HIGH (ref 101–111)
Creatinine, Ser: 1.81 mg/dL — ABNORMAL HIGH (ref 0.61–1.24)
GFR calc Af Amer: 36 mL/min — ABNORMAL LOW (ref >60)
GFR, EST NON AFRICAN AMERICAN: 31 mL/min — AB (ref >60)
GLUCOSE: 95 mg/dL (ref 65–99)
Potassium: 4.4 mmol/L (ref 3.5–5.1)
Sodium: 141 mmol/L (ref 135–145)
TOTAL PROTEIN: 6.1 g/dL — AB (ref 6.5–8.1)

## 2017-09-06 LAB — GLUCOSE, CAPILLARY
GLUCOSE-CAPILLARY: 121 mg/dL — AB (ref 65–99)
Glucose-Capillary: 85 mg/dL (ref 65–99)
Glucose-Capillary: 94 mg/dL (ref 65–99)

## 2017-09-06 LAB — PHOSPHORUS: Phosphorus: 2.5 mg/dL (ref 2.5–4.6)

## 2017-09-06 LAB — MAGNESIUM: MAGNESIUM: 1.9 mg/dL (ref 1.7–2.4)

## 2017-09-06 MED ORDER — SODIUM CHLORIDE 0.9 % IV BOLUS (SEPSIS)
500.0000 mL | Freq: Once | INTRAVENOUS | Status: AC
  Administered 2017-09-06: 500 mL via INTRAVENOUS

## 2017-09-06 MED ORDER — ALFUZOSIN HCL ER 10 MG PO TB24: 10.0000 mg | ORAL_TABLET | Freq: Every day | ORAL | 0 refills | Status: DC

## 2017-09-06 NOTE — Progress Notes (Signed)
Orders received for pt discharge.  Discharge summary printed and reviewed with pt.  Explained medication regimen, and pt had no further questions at this time.  IV removed and site remains clean, dry, intact.  Telemetry removed.  Pt in stable condition and awaiting transport. 

## 2017-09-06 NOTE — Plan of Care (Signed)
  Health Behavior/Discharge Planning: Ability to manage health-related needs will improve 09/06/2017 1202 - Progressing by Imagene Gurney, RN

## 2017-09-06 NOTE — Discharge Summary (Signed)
Physician Discharge Summary  Calvin Gray ZOX:[id number] DOB: 1925-12-31 DOA: 09/04/2017  PCP: [facility]  Admit date: 09/04/2017 Discharge date: 09/06/2017  Admitted From: Home Disposition: Home  Recommendations for Outpatient Follow-up:  1. Follow up with PCP in 1-2 weeks 2. Follow up with Cardiology Dr. Einar Gray within 1 week for 30 day Holter Monitor . 3. Please obtain CMP/CBC, Mag, Phos in one week 4. Please follow up on the following pending results:  Home Health: No  Equipment/Devices: None recommended by PT   Discharge Condition: Stable  CODE STATUS: FULL CODE Diet recommendation: Heart Healthy Diet   Brief/Interim Summary: Calvin Gray a 83 y.o.malewith medical history significantfor hypertension, high cholesterol, diabetes diet controlled,coronary artery disease status post PTCA and stenting 2013 presents to the emergency Department chief complaint of syncope. Initial evaluation reveals orthostatic hypotension. [facility] are asked to admit for Syncope.  According to the patient and wife they were at church during the morning morning of admission. Patient states he got up to go walk to the family school he felt dizzy. He sat on the floor. Wife was watching from a distance when she did not see him walking she went to investigate and found him on the floor. She panicked and  started chest compressions and mouth-to-mouth resuscitation. She noted that during chest compressions and mouth-to-mouth he "gurgled and threw up a little bit".He regained consciousness spontaneously.He reported incontinence of bladder and bowel. He was  rehydrated and underwent further workup for Syncope which was unrevealing. V/Q Scan was negative, EEG was Negative, Head CT showed no acute abnormalities, and ECHOCardiogram showed good EF. It was felt that patient's symptoms were related to hypovolemia and dehydration as patient was orthostatic. He was rehydrated and  orthostatics were checked today and improved. He was deemed medically stable to D/C home but was advised to follow up with PCP and Cardiology for Holter Monitor as an outpatient.   Discharge Diagnoses:  Principal Problem:   Syncope and collapse Active Problems:   CAD (coronary artery disease), native coronary artery   Complex sleep apnea syndrome   Hypertension   Syncope   Anemia   Hyperglycemia   CKD (chronic kidney disease) stage 4, GFR 15-29 ml/min (HCC)  Syncope likely Orthostatic r/o'd Other Etiologies -Likely related to orthostatic hypotension in the setting of lisinopril, hydrochlorothiazide,and alfuzosin. -No signs of infectious process. No metabolic derangement.  -EKG without acute changes. Initial troponin negative. Patient orthostatic at the scene.  -He received 500 mL normal saline in the emergency department and will repeat NS bolus today x4 -He remained orthostatic at the time of admission. In addition he reported incontinence of bowel and bladder during the episode. -Admitted to Telemetry -Gentle IV fluids with NS at 75 mL/hr decreased to 50 mL/hr and now D/C'd -Recheck orthostatic vital signs and was orthostatic this AM but improved later on in the afternoon -Hold Lisinopril/HCTZ until her blood pressure normalizes; Decreased Alfluzosin to once a day dosing.  -TED Hose -V/Q Scan done as could not get CTA of Chest due to Renal Fxn; V/Q Scan showed normal Ventilation-Perfusion Study and no evidence of Pulmonary Thromboembolism -Cycled Troponin; Las was <0.03 -EEG was normal recording of awake and drowsy states. -Head CT done and showed Age related volume loss with rather minimal periventricular small vessel disease. No mass, hemorrhage, or acute appearing infarct. Foci of arterial vascular calcification noted. There are foci of paranasal sinus disease. -ECHOCardiogram done and as below -PT Evaluate and Treat and recommended no Follow -Will need  outpatient Evaluation by  Cardiology Dr. Einar Gray for 30 day event montior  -Continue to Monitor Closely and follow up with PCP   Hyperglycemia -Patient with a medical history of Diet Controlled Diabetes Mellitus. Serum glucose 168 on admission. -Obtained a Hemoglobin A1c was 5.4 -Sliding scale for optimal control during hospitalization  -Carb modified diet -CBG's ranging from 85-121  Hypertension -See Above. Home medications include Hydrochlorothiazide, Lisinopril.Patient was orthostatic on admission -Continue Hold lisinopril and Hydrochlorothiazide at D/C -Continue to Monitor and follow up with PCP    Hx of Urinary Frequency  -Home medications includeAlfuzosin. -Patient was incontinent of urine during the episode.  -Bladder scan in the emergency department revealed 250 mL urine post fluid bolus. -We will continueAlfuzosin 10 mg po Daily instead of BID -When necessary in and out cath -Gentle IV fluids as Above -Monitor urine output -Post void bladder scan  Normocytic Anemia likely of Chronic Kidney Disease -Hemoglobin 10.8 on Admission.  -Hb/Hct now went to 10.2/30.1 -No signs of active bleeding. -Anemia Panel showed Iron Level of 61, UBIC of 248, TIBC of 309, Saturation Ratios of 20, Ferritin of 43, Folate 33.0, Vitamin B12 1446 -FOBT to be collected but can be done as an outpatient  -Continue follow-up with PCP  CAD -No chest pain. Initial troponin negative and repeat <0.03 -Continue to Monitor on Telemetry -Continue ASA 81 mg po Daily and Pravastatin 80 mg po Daily  -Continue to Monitor as an outpatient and follow up with Dr. Einar Gray  CKD Stage 4 -Last Cr from Recent Outpatient Records revealed Cr of 2.1 (Last Baseline in our system was 1.3-1.4 in 2013) -Continued IVF Rehydration as above. BUN/Cr improved to 28/1.81 -Avoid Nephrotoxic Medications -Repeat CMP as an outpatient   Discharge Instructions Discharge Instructions    Call MD for:  difficulty breathing, headache or visual  disturbances   Complete by:  As directed    Call MD for:  extreme fatigue   Complete by:  As directed    Call MD for:  hives   Complete by:  As directed    Call MD for:  persistant dizziness or light-headedness   Complete by:  As directed    Call MD for:  persistant nausea and vomiting   Complete by:  As directed    Call MD for:  redness, tenderness, or signs of infection (pain, swelling, redness, odor or green/yellow discharge around incision site)   Complete by:  As directed    Call MD for:  severe uncontrolled pain   Complete by:  As directed    Call MD for:  temperature >100.4   Complete by:  As directed    Diet - low sodium heart healthy   Complete by:  As directed    Discharge instructions   Complete by:  As directed    Follow up with PCP and with Cardiology Dr. Einar Gray within 1-2 weeks. Take all medications as prescribed. If symptoms change or worsen please return to the ED for evaluation.   Increase activity slowly   Complete by:  As directed      Allergies as of 09/06/2017   No Known Allergies     Medication List    STOP taking these medications   lisinopril-hydrochlorothiazide 10-12.5 MG tablet Commonly known as:  PRINZIDE,ZESTORETIC     TAKE these medications   alfuzosin 10 MG 24 hr tablet Commonly known as:  UROXATRAL Take 1 tablet (10 mg total) by mouth daily with breakfast. What changed:  when to take this  aspirin EC 81 MG tablet Take 81 mg by mouth daily.   multivitamin tablet Take 1 tablet by mouth daily.   pravastatin 80 MG tablet Commonly known as:  PRAVACHOL Take 80 mg by mouth daily.   temazepam 15 MG capsule Commonly known as:  RESTORIL Take 15 mg by mouth at bedtime as needed for sleep.      Follow-up Information    [facility]. Call.   Specialty:  Rheumatology Why:  Follow up with PCP within 1 week Contact information: [place] [address] [phone]        Adrian Prows, MD. Call.    Specialty:  Cardiology Why:  Follow up with Cardiology for 30 day Monitor Contact information: [place] [address] [phone]          No Known Allergies  Consultations:  None  Procedures/Studies: Dg Chest 2 View  Result Date: 09/04/2017 CLINICAL DATA:  Syncope EXAM: CHEST  2 VIEW COMPARISON:  None. FINDINGS: There is elevation of the right diaphragm. There is no focal parenchymal opacity. There is no pleural effusion or pneumothorax. The heart and mediastinal contours are unremarkable. The osseous structures are unremarkable. IMPRESSION: No active cardiopulmonary disease. Electronically Signed   By: Kathreen Devoid   On: 09/04/2017 15:22   Ct Head Wo Contrast  Result Date: 09/05/2017 CLINICAL DATA:  Syncope EXAM: CT HEAD WITHOUT CONTRAST TECHNIQUE: Contiguous axial images were obtained from the base of the skull through the vertex without intravenous contrast. COMPARISON:  None. FINDINGS: Brain: There is age related volume loss. There is no intracranial mass, hemorrhage, extra-axial fluid collection, midline shift. There is slight small vessel disease in the centra semiovale bilaterally. Elsewhere gray-white compartments appear normal. No evident acute infarct. Vascular: No hyperdense vessel. There are foci of calcification in each carotid siphon. Skull: Bony calvarium appears intact. Sinuses/Orbits: There is opacification in several ethmoid air cells. There is a small retention cyst in the lateral right maxillary antrum. Other visualized paranasal sinuses are clear. Orbits appear symmetric bilaterally. Other: Mastoid air cells are clear. IMPRESSION: Age related volume loss with rather minimal periventricular small vessel disease. No mass, hemorrhage, or acute appearing infarct. Foci of arterial vascular calcification noted. There are foci of paranasal sinus disease. Electronically Signed   By: Lowella Grip III M.D.   On: 09/05/2017 09:28   Nm Pulmonary  Perf And Vent  Result Date: 09/05/2017 CLINICAL DATA:  Syncope.  Bradycardia.  Dyspnea on exertion. EXAM: NUCLEAR MEDICINE VENTILATION - PERFUSION LUNG SCAN TECHNIQUE: Ventilation images were obtained in multiple projections using inhaled aerosol Tc-28m DTPA. Perfusion images were obtained in multiple projections after intravenous injection of Tc-12m MAA. RADIOPHARMACEUTICALS:  30.6 mCi Technetium-21m DTPA aerosol inhalation and 4.27 mCi Technetium-36m MAA IV COMPARISON:  Chest radiograph, 09/04/2017 FINDINGS: Ventilation: No focal ventilation defect. Perfusion: No wedge shaped peripheral perfusion defects to suggest acute pulmonary embolism. IMPRESSION: Normal ventilation-perfusion study. No evidence of pulmonary thromboembolism. Electronically Signed   By: Lajean Manes M.D.   On: 09/05/2017 17:07   EEG ANALYSIS: A 16 channel recording using standard 10 20 measurements is conducted for35minutes. There is a well-formed posterior dominant rhythm of 9-9.5 hertz which attenuates with eye opening. There is beta activity observed in the frontal areas. Awake and drowsy activities are observed. Photic stimulation is carried out without abnormal changes in the background activity. There is no focal or lateralized slowing. There is no epileptiform activity is observed.  IMPRESSION: 1. This is a  normal recording of awake and drowsy states.  ECHOCARDIOGRAM ------------------------------------------------------------------- Study Conclusions  - Left ventricle: The cavity size was normal. There was mild   concentric hypertrophy. Systolic function was normal. The   estimated ejection fraction was in the range of 60% to 65%. Wall   motion was normal; there were no regional wall motion   abnormalities. There was an increased relative contribution of   atrial contraction to ventricular filling. Doppler parameters are   consistent with abnormal left ventricular relaxation (grade 1   diastolic  dysfunction). - Aortic valve: Trileaflet; mildly thickened, mildly calcified   leaflets. There was trivial regurgitation. Valve area (VTI): 2.07   cm^2. Valve area (Vmax): 2.31 cm^2. Valve area (Vmean): 2.17   cm^2. - Mitral valve: There was mild regurgitation.  Subjective: Seen and examined and was feeling well. Had no SOB or CP. Denied any other complaints.  Discharge Exam: Vitals:   09/06/17 0043 09/06/17 0359  BP: (!) 112/53 131/62  Pulse: (!) 59 (!) 58  Resp: 18 18  Temp: 98.7 F (37.1 C) 98.6 F (37 C)  SpO2: 98% 97%   Vitals:   09/05/17 1900 09/05/17 1955 09/06/17 0043 09/06/17 0359  BP:  126/61 (!) 112/53 131/62  Pulse:  (!) 59 (!) 59 (!) 58  Resp:  18 18 18   Temp: 97.8 F (36.6 C) 98.4 F (36.9 C) 98.7 F (37.1 C) 98.6 F (37 C)  TempSrc: Oral Oral Oral Oral  SpO2:  96% 98% 97%  Weight:    71.8 kg (158 lb 3.2 oz)  Height:       General: Pt is a pleasant Caucasian elderly male who is alert, awake, not in acute distress Cardiovascular: RRR, S1/S2 +, no rubs, no gallops Respiratory: CTA bilaterally, no wheezing, no rhonchi Abdominal: Soft, NT, ND, bowel sounds + Extremities: no edema, no cyanosis  The results of significant diagnostics from this hospitalization (including imaging, microbiology, ancillary and laboratory) are listed below for reference.    Microbiology: No results found for this or any previous visit (from the past 240 hour(s)).   Labs: BNP (last 3 results) No results for input(s): BNP in the last 8760 hours. Basic Metabolic Panel: Recent Labs  Lab 09/04/17 1312 09/05/17 0603 09/06/17 0511  NA 138 139 141  K 4.4 4.2 4.4  CL 107 111 113*  CO2 22 23 22   GLUCOSE 168* 89 95  BUN 36* 34* 28*  CREATININE 2.01* 1.95* 1.81*  CALCIUM 8.7* 8.5* 8.5*  MG  --   --  1.9  PHOS  --   --  2.5   Liver Function Tests: Recent Labs  Lab 09/06/17 0511  AST 23  ALT 15*  ALKPHOS 52  BILITOT 0.8  PROT 6.1*  ALBUMIN 3.4*   No results for  input(s): LIPASE, AMYLASE in the last 168 hours. No results for input(s): AMMONIA in the last 168 hours. CBC: Recent Labs  Lab 09/04/17 1312 09/05/17 0603 09/06/17 0511  WBC 9.5 6.7 7.1  NEUTROABS  --   --  4.5  HGB 10.6* 9.8* 10.2*  HCT 30.7* 28.4* 30.1*  MCV 92.5 94.0 93.5  PLT 151 133* 143*   Cardiac Enzymes: Recent Labs  Lab 09/04/17 1749  TROPONINI <0.03   BNP: Invalid input(s): POCBNP CBG: Recent Labs  Lab 09/05/17 1725 09/05/17 2124 09/06/17 0545 09/06/17 0742 09/06/17 1153  GLUCAP 83 110* 85 94 121*   D-Dimer No results for input(s): DDIMER in the last 72 hours. Hgb A1c Recent Labs  09/04/17 1749  HGBA1C 5.4   Lipid Profile No results for input(s): CHOL, HDL, LDLCALC, TRIG, CHOLHDL, LDLDIRECT in the last 72 hours. Thyroid function studies No results for input(s): TSH, T4TOTAL, T3FREE, THYROIDAB in the last 72 hours.  Invalid input(s): FREET3 Anemia work up Recent Labs    09/04/17 1749  VITAMINB12 1,446*  FOLATE 33.0  FERRITIN 43  TIBC 309  IRON 61  RETICCTPCT 0.7   Urinalysis    Component Value Date/Time   COLORURINE YELLOW 09/04/2017 [place] 09/04/2017 1727   LABSPEC 1.017 09/04/2017 1727   PHURINE 5.0 09/04/2017 1727   GLUCOSEU NEGATIVE 09/04/2017 1727   HGBUR NEGATIVE 09/04/2017 1727   BILIRUBINUR NEGATIVE 09/04/2017 1727   KETONESUR NEGATIVE 09/04/2017 1727   PROTEINUR NEGATIVE 09/04/2017 1727   NITRITE NEGATIVE 09/04/2017 1727   LEUKOCYTESUR TRACE (A) 09/04/2017 1727   Sepsis Labs Invalid input(s): PROCALCITONIN,  WBC,  LACTICIDVEN Microbiology No results found for this or any previous visit (from the past 240 hour(s)).  Time coordinating discharge: 35 minutes  SIGNED:  Kerney Elbe, DO [facility] 09/06/2017, 7:23 PM Pager [phone]  If 7PM-7AM, please contact night-coverage [email or web address] Password [email or web address]

## 2017-09-06 NOTE — Evaluation (Signed)
Physical Therapy Evaluation Patient Details Name: Calvin Gray MRN: [id number] DOB: 13-Jan-1926 Today's Date: 09/06/2017   History of Present Illness  83 y.o. male with medical history significant for hypertension, high cholesterol, heart appearing, diabetes diet controlled, coronary artery disease status post PTCA and stenting 2013 presents to the emergency Department chief complaint of syncope. Initial evaluation reveals prostatic hypotension. NUCLEAR MEDICINE VENTILATION - PERFUSION LUNG SCAN revealed Normal ventilation-perfusion study. No evidence of pulmonary thromboembolism.  Clinical Impression  Patient evaluated by Physical Therapy with no further acute PT needs identified. All education has been completed and the patient has no further questions. Pt is mod I with bed mobility, transfers, ambulation of 300 feet and ascent/descent of 2 steps. Pt is at baseline level of function.    PT is signing off. Thank you for this referral.     Follow Up Recommendations No PT follow up    Equipment Recommendations  None recommended by PT       Precautions / Restrictions Precautions Precautions: Fall Restrictions Weight Bearing Restrictions: No      Mobility  Bed Mobility Overal bed mobility: Modified Independent                Transfers Overall transfer level: Modified independent Equipment used: Rolling walker (2 wheeled)             General transfer comment: good power up and steadying in RW  Ambulation/Gait Ambulation/Gait assistance: Modified independent (Device/Increase time) Ambulation Distance (Feet): 300 Feet(150 feet with RW, 150 without AD) Assistive device: Rolling walker (2 wheeled);None Gait Pattern/deviations: Step-through pattern;WFL(Within Functional Limits) Gait velocity: WFL Gait velocity interpretation: at or above normal speed for age/gender General Gait Details: initially ambulated with RW vc for proximity to RW and upright posture, in ambulation  without walker pt had strong, steady gait       Balance Overall balance assessment: No apparent balance deficits (not formally assessed)                                           Pertinent Vitals/Pain Pain Assessment: No/denies pain    Home Living Family/patient expects to be discharged to:: Private residence Living Arrangements: Spouse/significant other Available Help at Discharge: Family;Available 24 hours/day Type of Home: House Home Access: Stairs to enter   [employer] of Steps: 2 Home Layout: Multi-level;Laundry or work area in basement;Able to live on main level with bedroom/bathroom Home Equipment: Environmental consultant - 2 wheels;Cane - single point      Prior Function Level of Independence: Independent         Comments: community ambulator, driver, independent with ADLs, iADLs     Hand Dominance        Extremity/Trunk Assessment   Upper Extremity Assessment Upper Extremity Assessment: Overall WFL for tasks assessed    Lower Extremity Assessment Lower Extremity Assessment: RLE deficits/detail;LLE deficits/detail RLE Deficits / Details: ROM WFL, strength grossly assessed 5/5 LLE Deficits / Details: ROM WFL, strength grossly assessed 5/5       Communication   Communication: HOH  Cognition Arousal/Alertness: Awake/alert Behavior During Therapy: WFL for tasks assessed/performed Overall Cognitive Status: Within Functional Limits for tasks assessed                                        General Comments General  comments (skin integrity, edema, etc.): VSS, max HR with ambulation 85 bpm    Exercises     Assessment/Plan    PT Assessment Patent does not need any further PT services         PT Goals (Current goals can be found in the Care Plan section)  Acute Rehab PT Goals Patient Stated Goal: go home PT Goal Formulation: With patient     AM-PAC PT "6 Clicks" Daily Activity  Outcome Measure Difficulty turning  over in bed (including adjusting bedclothes, sheets and blankets)?: None Difficulty moving from lying on back to sitting on the side of the bed? : None Difficulty sitting down on and standing up from a chair with arms (e.g., wheelchair, bedside commode, etc,.)?: None Help needed moving to and from a bed to chair (including a wheelchair)?: None Help needed walking in hospital room?: None Help needed climbing 3-5 steps with a railing? : None 6 Click Score: 24    End of Session Equipment Utilized During Treatment: Gait belt Activity Tolerance: Patient tolerated treatment well Patient left: in chair;with call bell/phone within reach;with family/visitor present Nurse Communication: Mobility status      Time: 2876-8115 PT Time Calculation (min) (ACUTE ONLY): 27 min   Charges:   PT Evaluation $PT Eval Moderate Complexity: 1 Mod PT Treatments $Gait Training: 8-22 mins   PT G Codes:        Bernadette Armijo B. Migdalia Dk PT, DPT Acute Rehabilitation  [phone] Pager [phone]    [place] 09/06/2017, 1:10 PM

## 2017-09-07 LAB — GLUCOSE, CAPILLARY
Glucose-Capillary: 126 mg/dL — ABNORMAL HIGH (ref 65–99)
Glucose-Capillary: 124 mg/dL — ABNORMAL HIGH (ref 65–99)
Glucose-Capillary: 84 mg/dL (ref 65–99)

## 2017-09-14 DIAGNOSIS — R5381 Other malaise: Secondary | ICD-10-CM | POA: Diagnosis not present

## 2017-09-14 DIAGNOSIS — R55 Syncope and collapse: Secondary | ICD-10-CM | POA: Diagnosis not present

## 2017-09-14 DIAGNOSIS — R5383 Other fatigue: Secondary | ICD-10-CM | POA: Diagnosis not present

## 2017-09-14 DIAGNOSIS — I2510 Atherosclerotic heart disease of native coronary artery without angina pectoris: Secondary | ICD-10-CM | POA: Diagnosis not present

## 2017-09-21 DIAGNOSIS — H6122 Impacted cerumen, left ear: Secondary | ICD-10-CM | POA: Diagnosis not present

## 2017-09-21 DIAGNOSIS — N183 Chronic kidney disease, stage 3 (moderate): Secondary | ICD-10-CM | POA: Diagnosis not present

## 2017-09-28 DIAGNOSIS — I2510 Atherosclerotic heart disease of native coronary artery without angina pectoris: Secondary | ICD-10-CM | POA: Diagnosis not present

## 2017-09-28 DIAGNOSIS — R55 Syncope and collapse: Secondary | ICD-10-CM | POA: Diagnosis not present

## 2017-09-28 DIAGNOSIS — E785 Hyperlipidemia, unspecified: Secondary | ICD-10-CM | POA: Diagnosis not present

## 2017-09-28 DIAGNOSIS — I129 Hypertensive chronic kidney disease with stage 1 through stage 4 chronic kidney disease, or unspecified chronic kidney disease: Secondary | ICD-10-CM | POA: Diagnosis not present

## 2017-10-21 DIAGNOSIS — H353121 Nonexudative age-related macular degeneration, left eye, early dry stage: Secondary | ICD-10-CM | POA: Diagnosis not present

## 2017-10-21 DIAGNOSIS — H04123 Dry eye syndrome of bilateral lacrimal glands: Secondary | ICD-10-CM | POA: Diagnosis not present

## 2017-10-21 DIAGNOSIS — H16223 Keratoconjunctivitis sicca, not specified as Sjogren's, bilateral: Secondary | ICD-10-CM | POA: Diagnosis not present

## 2017-10-21 DIAGNOSIS — H524 Presbyopia: Secondary | ICD-10-CM | POA: Diagnosis not present

## 2017-10-21 DIAGNOSIS — H52223 Regular astigmatism, bilateral: Secondary | ICD-10-CM | POA: Diagnosis not present

## 2017-10-21 DIAGNOSIS — H43813 Vitreous degeneration, bilateral: Secondary | ICD-10-CM | POA: Diagnosis not present

## 2017-11-17 DIAGNOSIS — R5383 Other fatigue: Secondary | ICD-10-CM | POA: Diagnosis not present

## 2017-11-17 DIAGNOSIS — R55 Syncope and collapse: Secondary | ICD-10-CM | POA: Diagnosis not present

## 2017-11-17 DIAGNOSIS — I2510 Atherosclerotic heart disease of native coronary artery without angina pectoris: Secondary | ICD-10-CM | POA: Diagnosis not present

## 2017-11-17 DIAGNOSIS — R5381 Other malaise: Secondary | ICD-10-CM | POA: Diagnosis not present

## 2017-12-02 DIAGNOSIS — H353121 Nonexudative age-related macular degeneration, left eye, early dry stage: Secondary | ICD-10-CM | POA: Diagnosis not present

## 2017-12-02 DIAGNOSIS — H43813 Vitreous degeneration, bilateral: Secondary | ICD-10-CM | POA: Diagnosis not present

## 2017-12-02 DIAGNOSIS — H16223 Keratoconjunctivitis sicca, not specified as Sjogren's, bilateral: Secondary | ICD-10-CM | POA: Diagnosis not present

## 2017-12-02 DIAGNOSIS — H04123 Dry eye syndrome of bilateral lacrimal glands: Secondary | ICD-10-CM | POA: Diagnosis not present

## 2017-12-06 DIAGNOSIS — R972 Elevated prostate specific antigen [PSA]: Secondary | ICD-10-CM | POA: Diagnosis not present

## 2018-01-05 DIAGNOSIS — N401 Enlarged prostate with lower urinary tract symptoms: Secondary | ICD-10-CM | POA: Diagnosis not present

## 2018-01-05 DIAGNOSIS — R972 Elevated prostate specific antigen [PSA]: Secondary | ICD-10-CM | POA: Diagnosis not present

## 2018-01-05 DIAGNOSIS — R351 Nocturia: Secondary | ICD-10-CM | POA: Diagnosis not present

## 2018-01-19 DIAGNOSIS — R600 Localized edema: Secondary | ICD-10-CM | POA: Diagnosis not present

## 2018-01-23 DIAGNOSIS — R600 Localized edema: Secondary | ICD-10-CM | POA: Diagnosis not present

## 2018-02-13 DIAGNOSIS — I10 Essential (primary) hypertension: Secondary | ICD-10-CM | POA: Diagnosis not present

## 2018-02-13 DIAGNOSIS — E785 Hyperlipidemia, unspecified: Secondary | ICD-10-CM | POA: Diagnosis not present

## 2018-02-21 DIAGNOSIS — E785 Hyperlipidemia, unspecified: Secondary | ICD-10-CM | POA: Diagnosis not present

## 2018-02-21 DIAGNOSIS — I872 Venous insufficiency (chronic) (peripheral): Secondary | ICD-10-CM | POA: Diagnosis not present

## 2018-02-21 DIAGNOSIS — N183 Chronic kidney disease, stage 3 (moderate): Secondary | ICD-10-CM | POA: Diagnosis not present

## 2018-02-21 DIAGNOSIS — I129 Hypertensive chronic kidney disease with stage 1 through stage 4 chronic kidney disease, or unspecified chronic kidney disease: Secondary | ICD-10-CM | POA: Diagnosis not present

## 2018-02-21 DIAGNOSIS — I2510 Atherosclerotic heart disease of native coronary artery without angina pectoris: Secondary | ICD-10-CM | POA: Diagnosis not present

## 2018-06-07 DIAGNOSIS — I10 Essential (primary) hypertension: Secondary | ICD-10-CM | POA: Diagnosis not present

## 2018-06-07 DIAGNOSIS — I2510 Atherosclerotic heart disease of native coronary artery without angina pectoris: Secondary | ICD-10-CM | POA: Diagnosis not present

## 2018-06-07 DIAGNOSIS — R5381 Other malaise: Secondary | ICD-10-CM | POA: Diagnosis not present

## 2018-06-07 DIAGNOSIS — R001 Bradycardia, unspecified: Secondary | ICD-10-CM | POA: Diagnosis not present

## 2018-06-08 DIAGNOSIS — H60333 Swimmer's ear, bilateral: Secondary | ICD-10-CM | POA: Diagnosis not present

## 2018-06-08 DIAGNOSIS — H6123 Impacted cerumen, bilateral: Secondary | ICD-10-CM | POA: Diagnosis not present

## 2018-07-08 DIAGNOSIS — Z23 Encounter for immunization: Secondary | ICD-10-CM | POA: Diagnosis not present

## 2018-07-19 DIAGNOSIS — R5381 Other malaise: Secondary | ICD-10-CM | POA: Diagnosis not present

## 2018-07-19 DIAGNOSIS — I2510 Atherosclerotic heart disease of native coronary artery without angina pectoris: Secondary | ICD-10-CM | POA: Diagnosis not present

## 2018-07-19 DIAGNOSIS — I10 Essential (primary) hypertension: Secondary | ICD-10-CM | POA: Diagnosis not present

## 2018-07-19 DIAGNOSIS — R5383 Other fatigue: Secondary | ICD-10-CM | POA: Diagnosis not present

## 2018-07-25 ENCOUNTER — Ambulatory Visit (HOSPITAL_COMMUNITY): Payer: Medicare Other | Attending: Cardiology

## 2018-07-25 DIAGNOSIS — R5382 Chronic fatigue, unspecified: Secondary | ICD-10-CM | POA: Diagnosis not present

## 2018-07-25 DIAGNOSIS — R0609 Other forms of dyspnea: Secondary | ICD-10-CM | POA: Diagnosis not present

## 2018-07-25 DIAGNOSIS — R5383 Other fatigue: Secondary | ICD-10-CM

## 2018-08-02 DIAGNOSIS — I2510 Atherosclerotic heart disease of native coronary artery without angina pectoris: Secondary | ICD-10-CM | POA: Diagnosis not present

## 2018-08-02 DIAGNOSIS — R0602 Shortness of breath: Secondary | ICD-10-CM | POA: Diagnosis not present

## 2018-08-02 DIAGNOSIS — I10 Essential (primary) hypertension: Secondary | ICD-10-CM | POA: Diagnosis not present

## 2018-08-02 DIAGNOSIS — G4733 Obstructive sleep apnea (adult) (pediatric): Secondary | ICD-10-CM | POA: Diagnosis not present

## 2018-08-15 DIAGNOSIS — I2510 Atherosclerotic heart disease of native coronary artery without angina pectoris: Secondary | ICD-10-CM | POA: Diagnosis not present

## 2018-08-15 DIAGNOSIS — E785 Hyperlipidemia, unspecified: Secondary | ICD-10-CM | POA: Diagnosis not present

## 2018-08-15 DIAGNOSIS — I10 Essential (primary) hypertension: Secondary | ICD-10-CM | POA: Diagnosis not present

## 2018-08-15 DIAGNOSIS — I129 Hypertensive chronic kidney disease with stage 1 through stage 4 chronic kidney disease, or unspecified chronic kidney disease: Secondary | ICD-10-CM | POA: Diagnosis not present

## 2018-08-15 DIAGNOSIS — N183 Chronic kidney disease, stage 3 (moderate): Secondary | ICD-10-CM | POA: Diagnosis not present

## 2018-08-15 DIAGNOSIS — Z0000 Encounter for general adult medical examination without abnormal findings: Secondary | ICD-10-CM | POA: Diagnosis not present

## 2018-08-22 DIAGNOSIS — E1169 Type 2 diabetes mellitus with other specified complication: Secondary | ICD-10-CM | POA: Diagnosis not present

## 2018-08-22 DIAGNOSIS — I2510 Atherosclerotic heart disease of native coronary artery without angina pectoris: Secondary | ICD-10-CM | POA: Diagnosis not present

## 2018-08-22 DIAGNOSIS — I129 Hypertensive chronic kidney disease with stage 1 through stage 4 chronic kidney disease, or unspecified chronic kidney disease: Secondary | ICD-10-CM | POA: Diagnosis not present

## 2018-08-22 DIAGNOSIS — I209 Angina pectoris, unspecified: Secondary | ICD-10-CM | POA: Diagnosis not present

## 2018-08-22 DIAGNOSIS — N183 Chronic kidney disease, stage 3 (moderate): Secondary | ICD-10-CM | POA: Diagnosis not present

## 2018-08-22 DIAGNOSIS — E782 Mixed hyperlipidemia: Secondary | ICD-10-CM | POA: Diagnosis not present

## 2018-11-01 ENCOUNTER — Ambulatory Visit (INDEPENDENT_AMBULATORY_CARE_PROVIDER_SITE_OTHER): Payer: Medicare Other | Admitting: Cardiology

## 2018-11-01 ENCOUNTER — Encounter: Payer: Self-pay | Admitting: Cardiology

## 2018-11-01 VITALS — BP 132/69 | HR 64 | Ht 70.0 in | Wt 164.0 lb

## 2018-11-01 DIAGNOSIS — G4731 Primary central sleep apnea: Secondary | ICD-10-CM

## 2018-11-01 DIAGNOSIS — R269 Unspecified abnormalities of gait and mobility: Secondary | ICD-10-CM

## 2018-11-01 DIAGNOSIS — R001 Bradycardia, unspecified: Secondary | ICD-10-CM | POA: Diagnosis not present

## 2018-11-01 DIAGNOSIS — I10 Essential (primary) hypertension: Secondary | ICD-10-CM | POA: Insufficient documentation

## 2018-11-01 DIAGNOSIS — Z0189 Encounter for other specified special examinations: Secondary | ICD-10-CM | POA: Diagnosis not present

## 2018-11-01 DIAGNOSIS — I2510 Atherosclerotic heart disease of native coronary artery without angina pectoris: Secondary | ICD-10-CM

## 2018-11-01 HISTORY — DX: Unspecified abnormalities of gait and mobility: R26.9

## 2018-11-01 HISTORY — DX: Bradycardia, unspecified: R00.1

## 2018-11-01 HISTORY — DX: Essential (primary) hypertension: I10

## 2018-11-01 MED ORDER — AMLODIPINE BESYLATE 10 MG PO TABS: 10.0000 mg | ORAL_TABLET | Freq: Every day | ORAL | 3 refills | Status: DC

## 2018-11-01 NOTE — Progress Notes (Signed)
Subjective:  Primary Physician:  Merrilee Seashore, MD  Patient ID: Calvin Gray, male    DOB: July 10, 1926, 85 y.o.   MRN: [id number]  Chief Complaint  Patient presents with  . Hypertension  . Follow-up  . Fatigue    HPI: Calvin Gray  is a 85 y.o. male  with CAD S/P stenting to Cx in 2013, OSA on CPAP, hyperlipidmia, hypertension, stage III CKD and decreased exercise tolerence presents for f/u. He underwent cardioi-pulmonary stress test on 07/25/2018, this revealed mild functional limitation, probably related to diastolic heart failure, hypertension, but age-related. Normal chronotropic response.  Due to dehydration when he presented to the emergency room with near syncope, all his blood pressure medications were discontinued.  His had an episode of syncope on 09/04/2017, hence all his and His medications were discontinued.  On his office visit 3 months ago it started him back on amlodipine 10 mg daily.  He now presents for follow-up.  He has been exercising almost on a regular basis.  Overall his outlook is improved.  He is feeling well, still is chronic fatigue but overall improved.  He is accompanied by his wife.  He brings his blood pressure recordings, very well controlled blood pressure occasionally blood pressure has been low around 100 mmHg.  Past Medical History:  Diagnosis Date  . Bradycardia by electrocardiogram 11/01/2018  . Bradycardia, sinus 02/08/12  . Essential hypertension, benign 11/01/2018  . Exertional dyspnea   . Gait disturbance 11/01/2018  . High cholesterol   . HOH (hard of hearing)   . Hypertension   . Sleep apnea   . Syncope 09/04/2017  . Syncope and collapse 09/04/2017  . Type II diabetes mellitus ([place])    "keeps it controlled w/diet and exercise"    Past Surgical History:  Procedure Laterality Date  . CATARACT EXTRACTION W/ INTRAOCULAR LENS  IMPLANT, BILATERAL    . CORONARY ANGIOPLASTY WITH STENT PLACEMENT  02/07/12   "1"  . LEFT HEART  CATHETERIZATION WITH CORONARY ANGIOGRAM N/A 02/08/2012   Procedure: LEFT HEART CATHETERIZATION WITH CORONARY ANGIOGRAM;  Surgeon: Laverda Page, MD;  Location: [facility] CATH LAB;  Service: Cardiovascular;  Laterality: N/A;    Social History   Socioeconomic History  . Marital status: Married    Spouse name: Not on file  . Number of children: 3  . Years of education: Not on file  . Highest education level: Not on file  Occupational History  . Occupation: retired  Scientific laboratory technician  . Financial resource strain: Not on file  . Food insecurity:    Worry: Not on file    Inability: Not on file  . Transportation needs:    Medical: Not on file    Non-medical: Not on file  Tobacco Use  . Smoking status: Never Smoker  . Smokeless tobacco: Never Used  Substance and Sexual Activity  . Alcohol use: No  . Drug use: No  . Sexual activity: Not Currently  Lifestyle  . Physical activity:    Days per week: Not on file    Minutes per session: Not on file  . Stress: Not on file  Relationships  . Social connections:    Talks on phone: Not on file    Gets together: Not on file    Attends religious service: Not on file    Active member of club or organization: Not on file    Attends meetings of clubs or organizations: Not on file    Relationship status: Not  on file  . Intimate partner violence:    Fear of current or ex partner: Not on file    Emotionally abused: Not on file    Physically abused: Not on file    Forced sexual activity: Not on file  Other Topics Concern  . Not on file  Social History Narrative  . Not on file    Current Outpatient Medications on File Prior to Visit  Medication Sig Dispense Refill  . acetaminophen (TYLENOL) 325 MG tablet Take 650 mg by mouth 2 (two) times daily.    Marland Kitchen alfuzosin (UROXATRAL) 10 MG 24 hr tablet Take 1 tablet (10 mg total) by mouth daily with breakfast. (Patient taking differently: Take 10 mg by mouth daily. ) 300 tablet 0  . aspirin EC 81 MG tablet  Take 81 mg by mouth daily.    . finasteride (PROSCAR) 5 MG tablet Take 5 mg by mouth daily.    . Multiple Vitamin (MULTIVITAMIN) tablet Take 1 tablet by mouth daily.    . pravastatin (PRAVACHOL) 80 MG tablet Take 40 mg by mouth 2 (two) times daily.     . temazepam (RESTORIL) 15 MG capsule Take 15 mg by mouth at bedtime as needed for sleep.     No current facility-administered medications on file prior to visit.      Review of Systems  Constitutional: Positive for malaise/fatigue (chronic). Negative for weight loss.  Respiratory: Positive for shortness of breath (chronic and stable). Negative for cough and hemoptysis.   Cardiovascular: Negative for chest pain, palpitations, claudication and leg swelling.  Gastrointestinal: Negative for abdominal pain, blood in stool, constipation, heartburn and vomiting.  Genitourinary: Negative for dysuria.  Musculoskeletal: Negative for joint pain and myalgias.  Neurological: Positive for dizziness (occasional). Negative for focal weakness and headaches.       Gait instability  Endo/Heme/Allergies: Does not bruise/bleed easily.  Psychiatric/Behavioral: Negative for depression. The patient is not nervous/anxious.   All other systems reviewed and are negative.     Objective:  Blood pressure 132/69, pulse 64, height 5' 10" (1.778 m), weight 164 lb (74.4 kg), SpO2 97 %. Body mass index is 23.53 kg/m.   Physical Exam  Constitutional: He appears well-developed and well-nourished. No distress.  Appears younger than stated age  HENT:  Head: Atraumatic.  Eyes: Conjunctivae are normal.  Neck: Neck supple. No JVD present. No thyromegaly present.  Cardiovascular: Normal rate, regular rhythm, normal heart sounds and intact distal pulses. Exam reveals no gallop.  No murmur heard. Pulmonary/Chest: Effort normal and breath sounds normal.  Abdominal: Soft. Bowel sounds are normal.  Musculoskeletal: Normal range of motion.        General: No edema.   Neurological: He is alert.  Skin: Skin is warm and dry.  Psychiatric: He has a normal mood and affect.    CARDIAC STUDIES:  CPX 07/25/2018:  CPX 07/25/2018: Conclusion: Exercise testing with gas exchange demonstrates mild functional impairment when compared to matched sedentary norms. There was a mild hypertensive response to exercise, however limitations are more likely due to age. Pre-exercise spirometry within normal limits. Overall mild functional limitation based on age-based norms. Suspect due to mild diastolic HF. No evidence of ischemia or pulmonary limitation noted. During exercise had occasional pacing spikes in QRS.  Coronary Angiogram  02/08/2012: Stent Mid Circumflex 3.5x22 Resolute. Residual RI sec branch 80% large.  Assessment & Recommendations:   1. Essential hypertension, benign  2. Bradycardia by electrocardiogram EKG 11/01/2018: Normal sinus rhythm at the rate of  63 bpm, normal axis, incomplete right bundle branch block.  No evidence of ischemia.  3. Coronary artery disease involving native coronary artery of native heart without angina pectoris Coronary Angiogram  02/08/2012: Stent Mid Circumflex 3.5x22 Resolute. Residual RI sec branch 80% large.  4. Complex sleep apnea syndrome  Compliant with CPAP and follows Pulmonary Medicine regularly since 2013  5. Laboratory examination 02/13/2018: RBC 3.4, hemoglobin 12.2, hematocrit 36.2, CBC otherwise normal.  Creatinine 1.7, EGFR 40/49, potassium 4.6, CMP otherwise normal.  Cholesterol 122, triglycerides 50, HDL 38, LDL 74.  Magnesium 1.9.  Vitamin B12 2495.  Recommendation: is presently doing well, his exercise capacity has increased since he is been exercising on a regular basis, with regard to coronary artery disease. He has not had angina pectoris, there is no clinical evidence of heart failure, compliant with CPAP and his blood pressure is also well controlled.    Advised him that he can cut the medication,  amlodipine from 10 mg to 5 mg on the days that his systolic blood pressure is 100 mmHg or less.  Otherwise he remains stable from cardiac standpoint, I'll see him back in 6 months.  With regard to bradycardia, he is essentially asymptomatic.  There is no high degree AV block.  Adrian Prows, MD, [facility] 11/01/2018, 8:01 PM [facility]. [place] Pager: [phone] Office: [phone] If no answer Cell [phone]

## 2018-11-03 DIAGNOSIS — H524 Presbyopia: Secondary | ICD-10-CM | POA: Diagnosis not present

## 2018-11-03 DIAGNOSIS — H52223 Regular astigmatism, bilateral: Secondary | ICD-10-CM | POA: Diagnosis not present

## 2018-11-03 DIAGNOSIS — H04123 Dry eye syndrome of bilateral lacrimal glands: Secondary | ICD-10-CM | POA: Diagnosis not present

## 2018-11-03 DIAGNOSIS — H43813 Vitreous degeneration, bilateral: Secondary | ICD-10-CM | POA: Diagnosis not present

## 2018-11-03 DIAGNOSIS — H353121 Nonexudative age-related macular degeneration, left eye, early dry stage: Secondary | ICD-10-CM | POA: Diagnosis not present

## 2018-11-03 DIAGNOSIS — H16223 Keratoconjunctivitis sicca, not specified as Sjogren's, bilateral: Secondary | ICD-10-CM | POA: Diagnosis not present

## 2019-01-26 IMAGING — NM NM PULMONARY VENT & PERF
16 series · 16 of 16 positions shown · non-contrast
Comparison: Chest radiograph, 09/04/2017

CLINICAL DATA: Syncope.  Bradycardia.  Dyspnea on exertion.

EXAM:
NUCLEAR MEDICINE VENTILATION - PERFUSION LUNG SCAN
TECHNIQUE: Ventilation images were obtained in multiple projections using
inhaled aerosol Kc-11m DTPA. Perfusion images were obtained in
multiple projections after intravenous injection of Kc-11m MAA.
RADIOPHARMACEUTICALS:  30.6 mCi Pechnetium-TTm DTPA aerosol
inhalation and 4.27 mCi Pechnetium-TTm MAA IV

[Series 1: ant/post vent · 4.14mm/px · 1 of 1 slices shown (1 of 2)]
[im 1/1]
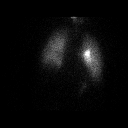

[Series 1: ant/post vent · 4.14mm/px · 1 of 1 slices shown (2 of 2)]
[im 1/1]
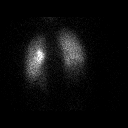

[Series 2: lao/rpo vent · 4.14mm/px · 1 of 1 slices shown (1 of 2)]
[im 1/1]
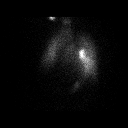

[Series 2: lao/rpo vent · 4.14mm/px · 1 of 1 slices shown (2 of 2)]
[im 1/1  full-range]
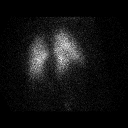

[Series 3: lpo/rao vent · 4.14mm/px · 1 of 1 slices shown (1 of 2)]
[im 1/1]
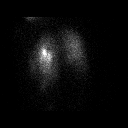

[Series 3: lpo/rao vent · 4.14mm/px · 1 of 1 slices shown (2 of 2)]
[im 1/1]
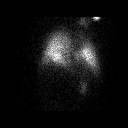

[Series 4: lt lat/rt lat vent · 4.14mm/px · 1 of 1 slices shown (1 of 2)]
[im 1/1]
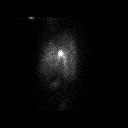

[Series 4: lt lat/rt lat vent · 4.14mm/px · 1 of 1 slices shown (2 of 2)]
[im 1/1]
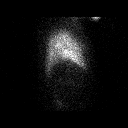

[Series 5: lt lat/rt lat perf · 4.14mm/px · 1 of 1 slices shown (1 of 2)]
[im 1/1]
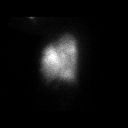

[Series 5: lt lat/rt lat perf · 4.14mm/px · 1 of 1 slices shown (2 of 2)]
[im 1/1]
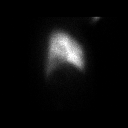

[Series 6: lpo/rao perf · 4.14mm/px · 1 of 1 slices shown (1 of 2)]
[im 1/1]
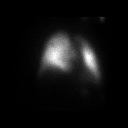

[Series 6: lpo/rao perf · 4.14mm/px · 1 of 1 slices shown (2 of 2)]
[im 1/1]
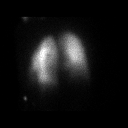

[Series 7: ant/post perf · 4.14mm/px · 1 of 1 slices shown (1 of 2)]
[im 1/1]
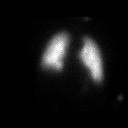

[Series 7: ant/post perf · 4.14mm/px · 1 of 1 slices shown (2 of 2)]
[im 1/1]
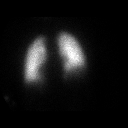

[Series 8: lao/rpo perf · 4.14mm/px · 1 of 1 slices shown (1 of 2)]
[im 1/1]
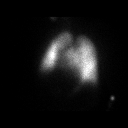

[Series 8: lao/rpo perf · 4.14mm/px · 1 of 1 slices shown (2 of 2)]
[im 1/1]
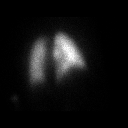

[16 of 16 positions shown; findings below may reference images not displayed]

FINDINGS: Ventilation: No focal ventilation defect.

Perfusion: No wedge shaped peripheral perfusion defects to suggest
acute pulmonary embolism.
IMPRESSION: Normal ventilation-perfusion study. No evidence of pulmonary
thromboembolism.

## 2019-01-26 IMAGING — CT CT HEAD W/O CM
4 series · 16 of 47 positions shown, 18 images · non-contrast
Comparison: None.

CLINICAL DATA: Syncope

EXAM:
CT HEAD WITHOUT CONTRAST
TECHNIQUE: Contiguous axial images were obtained from the base of the skull
through the vertex without intravenous contrast.

[Series 3: head wo · axial · 0.44mm/px · z∈[-102,+8]mm · 7 of 30 slices shown, 9 images]
[im 4/30  brain]
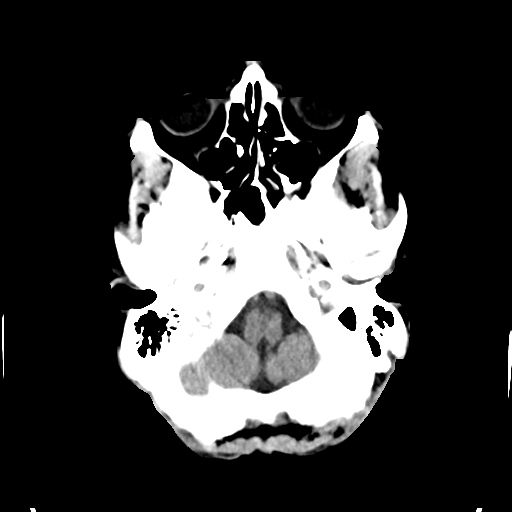
[im 4/30  bone]
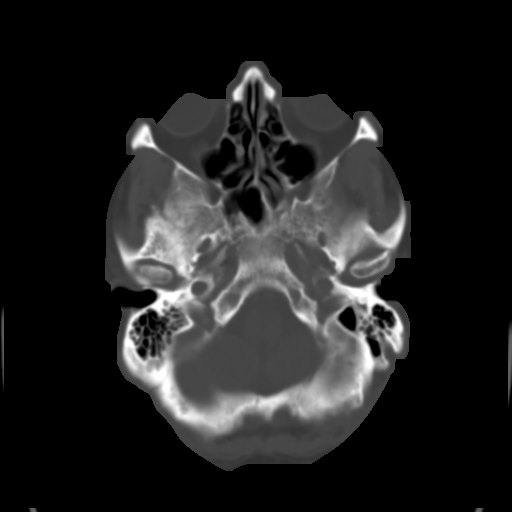
[im 8/30  brain]
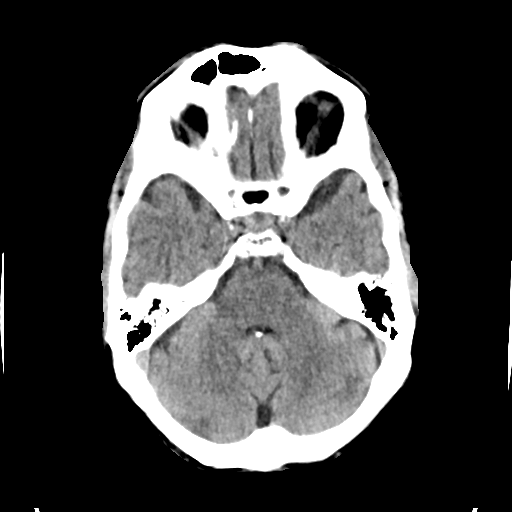
[im 11/30  brain]
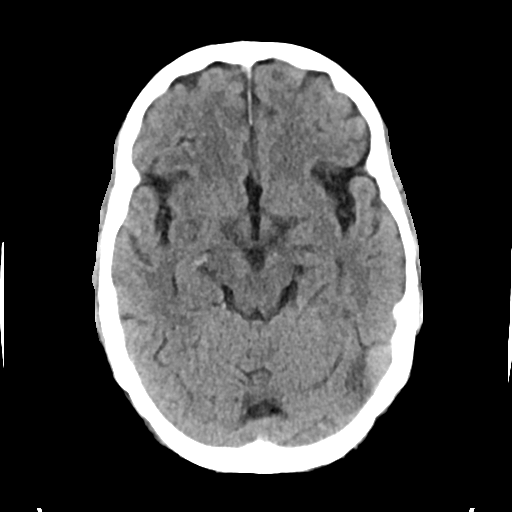
[im 15/30  brain]
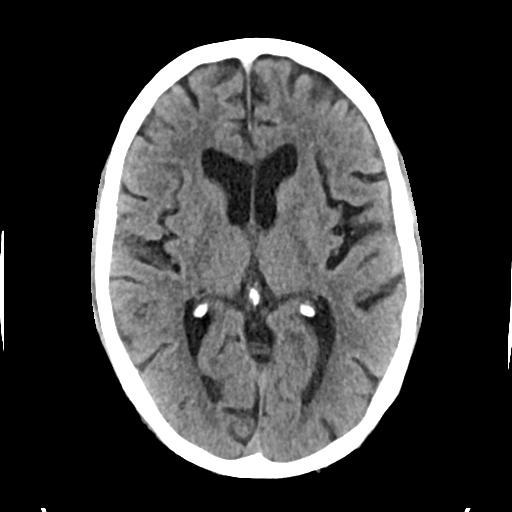
[im 19/30  brain]
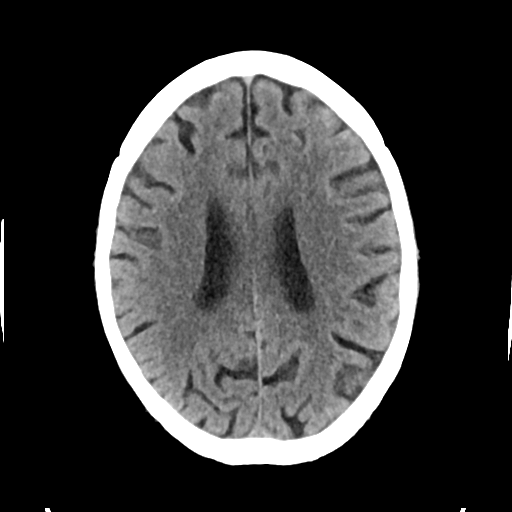
[im 19/30  bone]
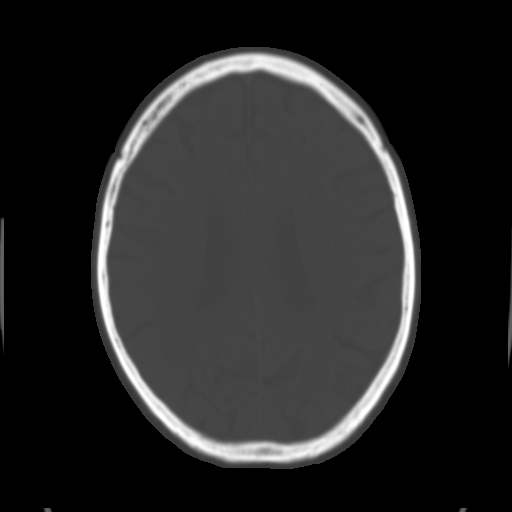
[im 22/30  brain]
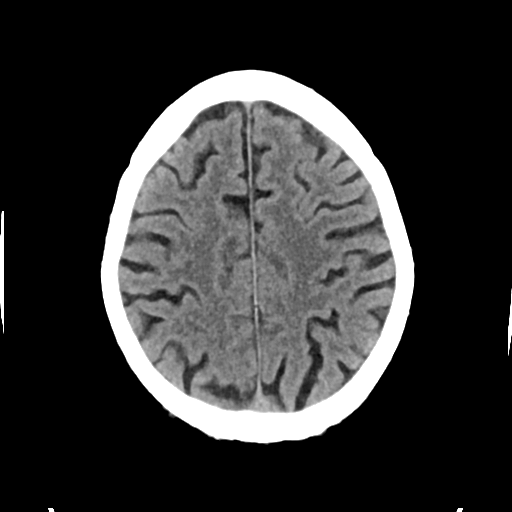
[im 26/30  brain]
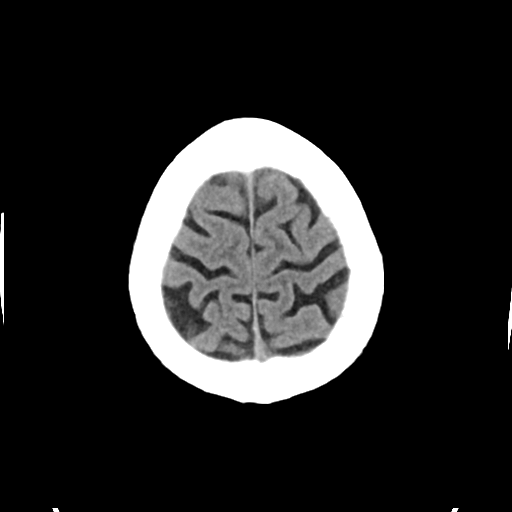

[Series 4: head bone · axial · 0.44mm/px · z∈[-102,-72]mm · 3 of 75 slices shown]
[im 8/75  bone]
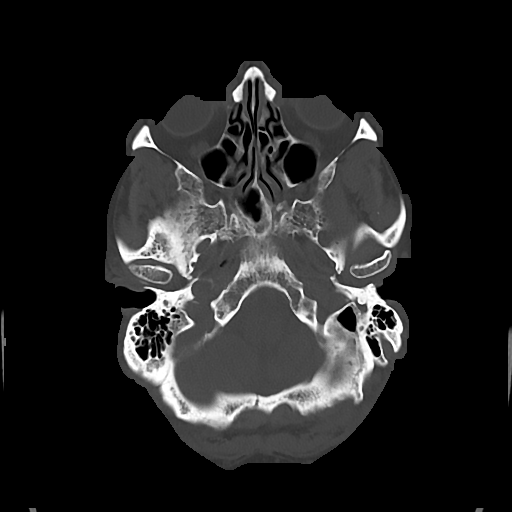
[im 15/75  bone]
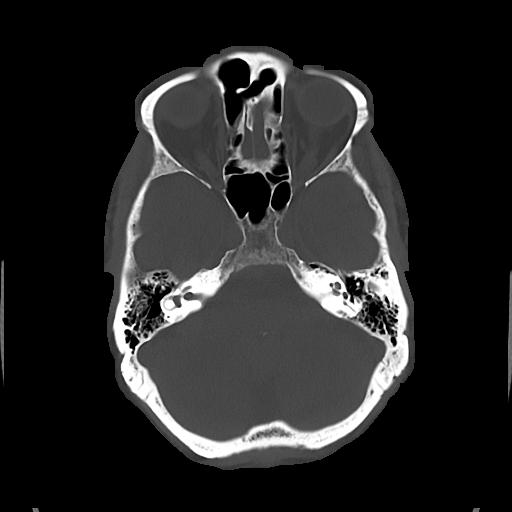
[im 23/75  bone]
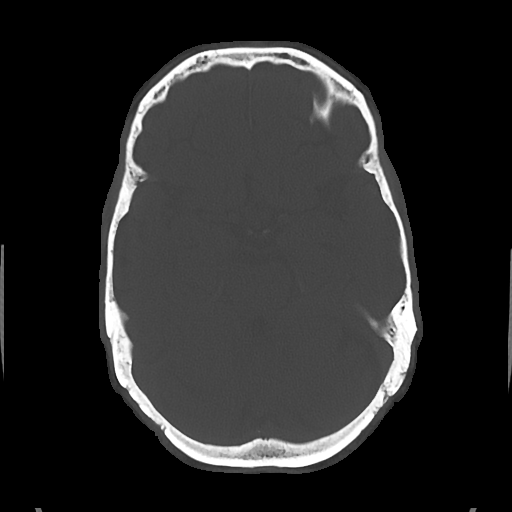

[Series 5: cor soft · coronal · 0.29mm/px · 3 of 67 slices shown]
[im 23/67  brain]
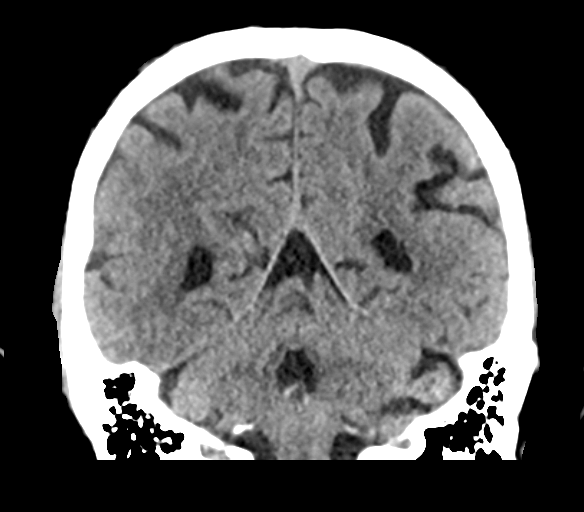
[im 30/67  brain]
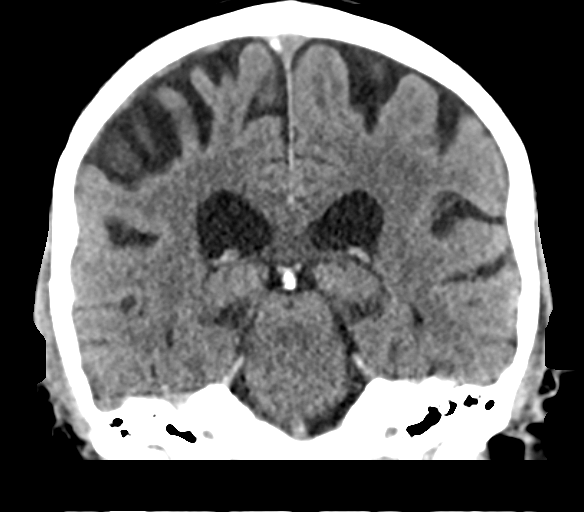
[im 37/67  brain]
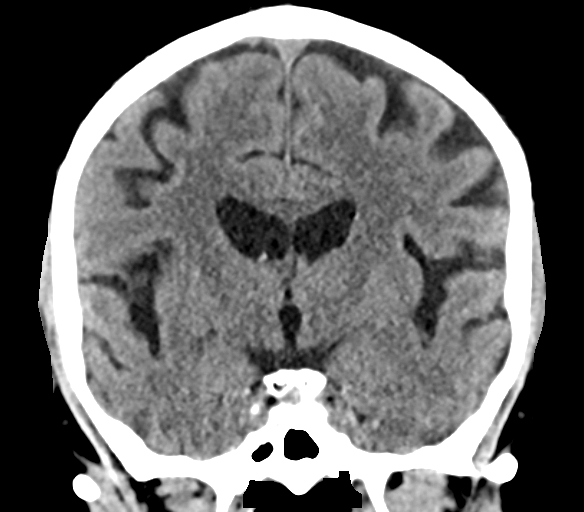

[Series 6: sag soft · sagittal · 0.29mm/px · 3 of 49 slices shown]
[im 17/49  brain]
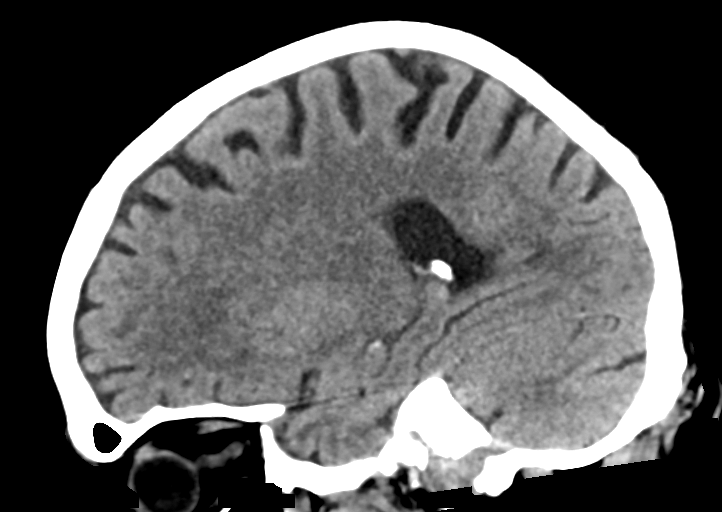
[im 25/49  brain]
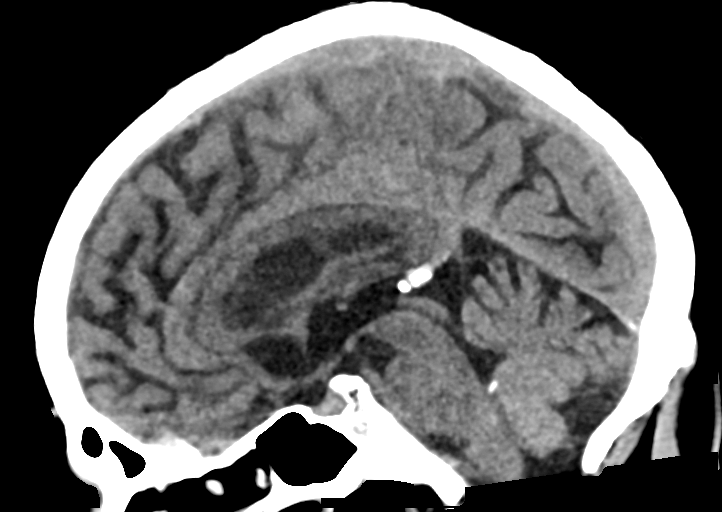
[im 33/49  brain]
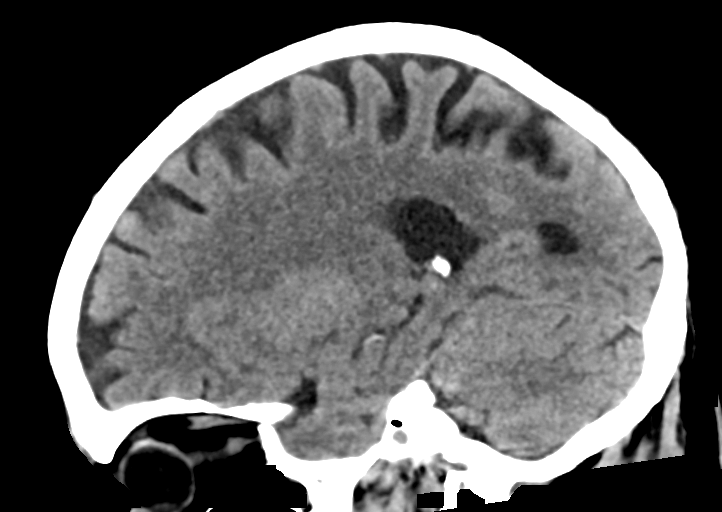

[16 of 47 positions shown; findings below may reference images not displayed]

FINDINGS: Brain: There is age related volume loss. There is no intracranial
mass, hemorrhage, extra-axial fluid collection, midline shift. There
is slight small vessel disease in the centra semiovale bilaterally.
Elsewhere gray-white compartments appear normal. No evident acute
infarct.

Vascular: No hyperdense vessel. There are foci of calcification in
each carotid siphon.

Skull: Bony calvarium appears intact.

Sinuses/Orbits: There is opacification in several ethmoid air cells.
There is a small retention cyst in the lateral right maxillary
antrum. Other visualized paranasal sinuses are clear. Orbits appear
symmetric bilaterally.

Other: Mastoid air cells are clear.
IMPRESSION: Age related volume loss with rather minimal periventricular small
vessel disease. No mass, hemorrhage, or acute appearing infarct.

Foci of arterial vascular calcification noted. There are foci of
paranasal sinus disease.

## 2019-02-20 DIAGNOSIS — N183 Chronic kidney disease, stage 3 (moderate): Secondary | ICD-10-CM | POA: Diagnosis not present

## 2019-02-20 DIAGNOSIS — I129 Hypertensive chronic kidney disease with stage 1 through stage 4 chronic kidney disease, or unspecified chronic kidney disease: Secondary | ICD-10-CM | POA: Diagnosis not present

## 2019-02-20 DIAGNOSIS — E782 Mixed hyperlipidemia: Secondary | ICD-10-CM | POA: Diagnosis not present

## 2019-02-20 DIAGNOSIS — I2510 Atherosclerotic heart disease of native coronary artery without angina pectoris: Secondary | ICD-10-CM | POA: Diagnosis not present

## 2019-02-27 DIAGNOSIS — N183 Chronic kidney disease, stage 3 (moderate): Secondary | ICD-10-CM | POA: Diagnosis not present

## 2019-02-27 DIAGNOSIS — E782 Mixed hyperlipidemia: Secondary | ICD-10-CM | POA: Diagnosis not present

## 2019-02-27 DIAGNOSIS — Z7189 Other specified counseling: Secondary | ICD-10-CM | POA: Diagnosis not present

## 2019-02-27 DIAGNOSIS — E1169 Type 2 diabetes mellitus with other specified complication: Secondary | ICD-10-CM | POA: Diagnosis not present

## 2019-05-04 DIAGNOSIS — N401 Enlarged prostate with lower urinary tract symptoms: Secondary | ICD-10-CM | POA: Diagnosis not present

## 2019-05-04 DIAGNOSIS — R351 Nocturia: Secondary | ICD-10-CM | POA: Diagnosis not present

## 2019-05-07 ENCOUNTER — Encounter: Payer: Self-pay | Admitting: Cardiology

## 2019-05-07 ENCOUNTER — Other Ambulatory Visit: Payer: Self-pay

## 2019-05-07 ENCOUNTER — Ambulatory Visit (INDEPENDENT_AMBULATORY_CARE_PROVIDER_SITE_OTHER): Payer: Medicare Other | Admitting: Cardiology

## 2019-05-07 VITALS — BP 156/74 | HR 67 | Temp 98.0°F | Ht 70.0 in | Wt 162.0 lb

## 2019-05-07 DIAGNOSIS — R001 Bradycardia, unspecified: Secondary | ICD-10-CM

## 2019-05-07 DIAGNOSIS — I2510 Atherosclerotic heart disease of native coronary artery without angina pectoris: Secondary | ICD-10-CM | POA: Diagnosis not present

## 2019-05-07 DIAGNOSIS — I10 Essential (primary) hypertension: Secondary | ICD-10-CM | POA: Diagnosis not present

## 2019-05-07 DIAGNOSIS — R600 Localized edema: Secondary | ICD-10-CM

## 2019-05-07 DIAGNOSIS — G4731 Primary central sleep apnea: Secondary | ICD-10-CM

## 2019-05-07 NOTE — Progress Notes (Signed)
Primary Physician/Referring:  Calvin Seashore, MD  Patient ID: Calvin Gray, male    DOB: [birth year], 85 y.o.   MRN: [id number]  Chief Complaint  Patient presents with  . Coronary Artery Disease  . Follow-up    6 months   HPI:    Calvin Gray  is a 85 y.o.   with CAD S/P stenting to Cx in 2013, OSA on CPAP, hyperlipidmia, hypertension, stage III CKD and decreased exercise tolerence presents for f/u.   He has been exercising almost on a regular basis.  Overall his outlook is improved.  He is feeling well, still is chronic fatigue but overall improved.  He is accompanied by his wife.  His wife states that this past week he chopped up a large trunk of tree for firewood.  He c/o leg swelling.  He has been wearing support stockings regularly.   Past Medical History:  Diagnosis Date  . Bradycardia by electrocardiogram 11/01/2018  . Bradycardia, sinus 02/08/12  . Essential hypertension, benign 11/01/2018  . Exertional dyspnea   . Gait disturbance 11/01/2018  . High cholesterol   . HOH (hard of hearing)   . Hypertension   . Sleep apnea   . Syncope 09/04/2017  . Syncope and collapse 09/04/2017  . Type II diabetes mellitus ([place])    "keeps it controlled w/diet and exercise"   Past Surgical History:  Procedure Laterality Date  . CATARACT EXTRACTION W/ INTRAOCULAR LENS  IMPLANT, BILATERAL    . CORONARY ANGIOPLASTY WITH STENT PLACEMENT  02/07/12   "1"  . LEFT HEART CATHETERIZATION WITH CORONARY ANGIOGRAM N/A 02/08/2012   Procedure: LEFT HEART CATHETERIZATION WITH CORONARY ANGIOGRAM;  Surgeon: Laverda Page, MD;  Location: [facility] CATH LAB;  Service: Cardiovascular;  Laterality: N/A;   Social History   Socioeconomic History  . Marital status: Married    Spouse name: Not on file  . Number of children: 3  . Years of education: Not on file  . Highest education level: Not on file  Occupational History  . Occupation: retired  Scientific laboratory technician  . Financial resource strain: Not on file   . Food insecurity    Worry: Not on file    Inability: Not on file  . Transportation needs    Medical: Not on file    Non-medical: Not on file  Tobacco Use  . Smoking status: Never Smoker  . Smokeless tobacco: Never Used  Substance and Sexual Activity  . Alcohol use: No  . Drug use: No  . Sexual activity: Not Currently  Lifestyle  . Physical activity    Days per week: Not on file    Minutes per session: Not on file  . Stress: Not on file  Relationships  . Social Herbalist on phone: Not on file    Gets together: Not on file    Attends religious service: Not on file    Active member of club or organization: Not on file    Attends meetings of clubs or organizations: Not on file    Relationship status: Not on file  . Intimate partner violence    Fear of current or ex partner: Not on file    Emotionally abused: Not on file    Physically abused: Not on file    Forced sexual activity: Not on file  Other Topics Concern  . Not on file  Social History Narrative  . Not on file   ROS  Review of Systems  Constitution: Positive  for malaise/fatigue. Negative for chills, decreased appetite and weight gain.  Cardiovascular: Positive for dyspnea on exertion (chronic and stable) and leg swelling. Negative for syncope.  Respiratory: Positive for sleep disturbances due to breathing (complex sleep apnea cpap complaint).   Endocrine: Negative for cold intolerance.  Hematologic/Lymphatic: Does not bruise/bleed easily.  Musculoskeletal: Negative for joint swelling.  Gastrointestinal: Negative for abdominal pain, anorexia, change in bowel habit, hematochezia and melena.  Neurological: Positive for dizziness. Negative for headaches and light-headedness.  Psychiatric/Behavioral: Negative for depression and substance abuse.  All other systems reviewed and are negative.  Objective  Blood pressure (!) 156/74, pulse 67, temperature 98 F (36.7 C), height 5' 10" (1.778 m), weight 162 lb  (73.5 kg), SpO2 98 %. Body mass index is 23.24 kg/m.   Physical Exam  Constitutional: He appears well-developed and well-nourished. No distress.  Appears younger than stated age  HENT:  Head: Atraumatic.  Eyes: Conjunctivae are normal.  Neck: Neck supple. No JVD present. No thyromegaly present.  Cardiovascular: Normal rate, regular rhythm, normal heart sounds and intact distal pulses. Exam reveals no gallop.  No murmur heard. Pulmonary/Chest: Effort normal and breath sounds normal.  Abdominal: Soft. Bowel sounds are normal.  Musculoskeletal: Normal range of motion.        General: No edema.  Neurological: He is alert.  Skin: Skin is warm and dry.  Psychiatric: He has a normal mood and affect.   Radiology: No results found.  Laboratory examination:   02/13/2018: RBC 3.4, hemoglobin 12.2, hematocrit 36.2, CBC otherwise normal.  Creatinine 1.7, EGFR 40/49, potassium 4.6, CMP otherwise normal.    Cholesterol 122, triglycerides 50, HDL 38, LDL 74.  Magnesium 1.9.  Vitamin B12 2495.  No results for input(s): NA, K, CL, CO2, GLUCOSE, BUN, CREATININE, CALCIUM, GFRNONAA, GFRAA in the last 8760 hours. CMP Latest Ref Rng & Units 09/06/2017 09/05/2017 09/04/2017  Glucose 65 - 99 mg/dL 95 89 168(H)  BUN 6 - 20 mg/dL 28(H) 34(H) 36(H)  Creatinine 0.61 - 1.24 mg/dL 1.81(H) 1.95(H) 2.01(H)  Sodium 135 - 145 mmol/L 141 139 138  Potassium 3.5 - 5.1 mmol/L 4.4 4.2 4.4  Chloride 101 - 111 mmol/L 113(H) 111 107  CO2 22 - 32 mmol/L _0 Calcium 8.9 - 10.3 mg/dL 8.5(L) 8.5(L) 8.7(L)  Total Protein 6.5 - 8.1 g/dL 6.1(L) - -  Total Bilirubin 0.3 - 1.2 mg/dL 0.8 - -  Alkaline Phos 38 - 126 U/L 52 - -  AST 15 - 41 U/L 23 - -  ALT 17 - 63 U/L 15(L) - -   CBC Latest Ref Rng & Units 09/06/2017 09/05/2017 09/04/2017  WBC 4.0 - 10.5 K/uL 7.1 6.7 9.5  Hemoglobin 13.0 - 17.0 g/dL 10.2(L) 9.8(L) 10.6(L)  Hematocrit 39.0 - 52.0 % 30.1(L) 28.4(L) 30.7(L)  Platelets 150 - 400 K/uL 143(L) 133(L) 151    Lipid Panel  No results found for: CHOL, TRIG, HDL, CHOLHDL, VLDL, LDLCALC, LDLDIRECT HEMOGLOBIN A1C Lab Results  Component Value Date   HGBA1C 5.4 09/04/2017   MPG 108.28 09/04/2017   TSH No results for input(s): TSH in the last 8760 hours. Medications   Prior to Admission medications   Medication Sig Start Date End Date Taking? Authorizing Provider  acetaminophen (TYLENOL) 325 MG tablet Take 650 mg by mouth 2 (two) times daily.    [provider]  alfuzosin (UROXATRAL) 10 MG 24 hr tablet Take 1 tablet (10 mg total) by mouth daily with breakfast. Patient taking differently: Take 10  mg by mouth daily.  09/06/17   Raiford Noble Latif, DO  amLODipine (NORVASC) 10 MG tablet Take 1 tablet (10 mg total) by mouth daily. 11/01/18   Adrian Prows, MD  aspirin EC 81 MG tablet Take 81 mg by mouth daily.    [provider]  finasteride (PROSCAR) 5 MG tablet Take 5 mg by mouth daily.    [provider]  Multiple Vitamin (MULTIVITAMIN) tablet Take 1 tablet by mouth daily.    [provider]  pravastatin (PRAVACHOL) 80 MG tablet Take 40 mg by mouth 2 (two) times daily.     [provider]  temazepam (RESTORIL) 15 MG capsule Take 15 mg by mouth at bedtime as needed for sleep.    [provider]     Current Outpatient Medications  Medication Instructions  . acetaminophen (TYLENOL) 650 mg, Oral, 2 times daily  . alfuzosin (UROXATRAL) 10 mg, Oral, Daily with breakfast  . amLODipine (NORVASC) 10 mg, Oral, Daily  . aspirin EC 81 mg, Oral, Daily  . finasteride (PROSCAR) 5 mg, Oral, Daily  . Multiple Vitamin (MULTIVITAMIN) tablet 1 tablet, Oral, Daily  . pravastatin (PRAVACHOL) 40 mg, Oral, 2 times daily  . temazepam (RESTORIL) 15 mg, Oral, At bedtime PRN    Cardiac Studies:   CPX 08/10/18:   CPX 10-Aug-2018: Conclusion: Exercise testing with gas exchange demonstrates mild functional impairment when compared to matched sedentary norms. There was a  mild hypertensive response to exercise, however limitations are more likely due to age. Pre-exercise spirometry within normal limits. Overall mild functional limitation based on age-based norms. Suspect due to mild diastolic HF. No evidence of ischemia or pulmonary limitation noted. During exercise had occasional pacing spikes in QRS.  Coronary Angiogram   02/08/2012: Stent Mid Circumflex 3.5x22 Resolute. Residual RI sec branch 80% large.   Assessment     ICD-10-CM   1. Coronary artery disease involving native coronary artery of native heart without angina pectoris  I25.10 EKG 12-Lead  2. Essential hypertension, benign  I10   3. Bradycardia by electrocardiogram  R00.1   4. Complex sleep apnea syndrome  G47.31   5. Lower leg edema  R60.0     EKG 05/07/2019: Normal sinus rhythm at rate of 60 bpm, left axis deviation, left anterior fascicular block.  IVCD, LVH.  Nonspecific T abnormality.   EKG 11/01/2018: Normal sinus rhythm at the rate of 63 bpm, normal axis, incomplete right bundle branch block.  No evidence of ischemia.   Recommendations:    Mr. Daffin is now 85 years of age, still remains active but main complaint continues to be decreased exercise tolerance.  He has had a fairly good cardiac work-up which revealed decrease in physiologic function due to age.  I simply reassured him.  His blood pressure previously has been well controlled and his wife states that blood pressure is well controlled at home as well and hence I did not mixed with her medications.  His leg edema may be related to amlodipine but also mild venous insufficiency as well due to age.  He will continue to wear support stockings.  He does have stage III chronic kidney disease, we could also consider furosemide 20 or even 40 mg once every other day if symptoms get worse.  He has been compliant with his use of CPAP, no changes in the medications were done today, he has not had any recurrence of angina.  I will see him  back on an annual basis.  Ulice Dash  Einar Gip, MD, [facility] 05/07/2019, 1:26 PM [facility]. [place] Pager: [phone] Office: [phone] If no answer Cell [phone]

## 2019-06-28 DIAGNOSIS — Z23 Encounter for immunization: Secondary | ICD-10-CM | POA: Diagnosis not present

## 2019-08-21 DIAGNOSIS — E1169 Type 2 diabetes mellitus with other specified complication: Secondary | ICD-10-CM | POA: Diagnosis not present

## 2019-08-21 DIAGNOSIS — N183 Chronic kidney disease, stage 3 unspecified: Secondary | ICD-10-CM | POA: Diagnosis not present

## 2019-08-21 DIAGNOSIS — E785 Hyperlipidemia, unspecified: Secondary | ICD-10-CM | POA: Diagnosis not present

## 2019-08-21 DIAGNOSIS — I10 Essential (primary) hypertension: Secondary | ICD-10-CM | POA: Diagnosis not present

## 2019-08-21 DIAGNOSIS — Z0000 Encounter for general adult medical examination without abnormal findings: Secondary | ICD-10-CM | POA: Diagnosis not present

## 2019-08-21 DIAGNOSIS — E782 Mixed hyperlipidemia: Secondary | ICD-10-CM | POA: Diagnosis not present

## 2019-08-21 DIAGNOSIS — E1122 Type 2 diabetes mellitus with diabetic chronic kidney disease: Secondary | ICD-10-CM | POA: Diagnosis not present

## 2019-10-26 ENCOUNTER — Other Ambulatory Visit: Payer: Self-pay | Admitting: Cardiology

## 2019-10-26 DIAGNOSIS — I10 Essential (primary) hypertension: Secondary | ICD-10-CM

## 2020-02-04 DIAGNOSIS — H524 Presbyopia: Secondary | ICD-10-CM | POA: Diagnosis not present

## 2020-02-04 DIAGNOSIS — H04123 Dry eye syndrome of bilateral lacrimal glands: Secondary | ICD-10-CM | POA: Diagnosis not present

## 2020-02-04 DIAGNOSIS — H52223 Regular astigmatism, bilateral: Secondary | ICD-10-CM | POA: Diagnosis not present

## 2020-02-04 DIAGNOSIS — H43813 Vitreous degeneration, bilateral: Secondary | ICD-10-CM | POA: Diagnosis not present

## 2020-02-04 DIAGNOSIS — H353121 Nonexudative age-related macular degeneration, left eye, early dry stage: Secondary | ICD-10-CM | POA: Diagnosis not present

## 2020-02-04 DIAGNOSIS — H16223 Keratoconjunctivitis sicca, not specified as Sjogren's, bilateral: Secondary | ICD-10-CM | POA: Diagnosis not present

## 2020-02-12 DIAGNOSIS — I209 Angina pectoris, unspecified: Secondary | ICD-10-CM | POA: Diagnosis not present

## 2020-02-12 DIAGNOSIS — I129 Hypertensive chronic kidney disease with stage 1 through stage 4 chronic kidney disease, or unspecified chronic kidney disease: Secondary | ICD-10-CM | POA: Diagnosis not present

## 2020-02-12 DIAGNOSIS — E782 Mixed hyperlipidemia: Secondary | ICD-10-CM | POA: Diagnosis not present

## 2020-02-12 DIAGNOSIS — E1169 Type 2 diabetes mellitus with other specified complication: Secondary | ICD-10-CM | POA: Diagnosis not present

## 2020-02-12 DIAGNOSIS — I2510 Atherosclerotic heart disease of native coronary artery without angina pectoris: Secondary | ICD-10-CM | POA: Diagnosis not present

## 2020-02-19 DIAGNOSIS — E782 Mixed hyperlipidemia: Secondary | ICD-10-CM | POA: Diagnosis not present

## 2020-02-19 DIAGNOSIS — G4733 Obstructive sleep apnea (adult) (pediatric): Secondary | ICD-10-CM | POA: Diagnosis not present

## 2020-02-19 DIAGNOSIS — I129 Hypertensive chronic kidney disease with stage 1 through stage 4 chronic kidney disease, or unspecified chronic kidney disease: Secondary | ICD-10-CM | POA: Diagnosis not present

## 2020-02-19 DIAGNOSIS — E1169 Type 2 diabetes mellitus with other specified complication: Secondary | ICD-10-CM | POA: Diagnosis not present

## 2020-02-19 DIAGNOSIS — N1832 Chronic kidney disease, stage 3b: Secondary | ICD-10-CM | POA: Diagnosis not present

## 2020-02-19 DIAGNOSIS — R1313 Dysphagia, pharyngeal phase: Secondary | ICD-10-CM | POA: Diagnosis not present

## 2020-02-19 DIAGNOSIS — R600 Localized edema: Secondary | ICD-10-CM | POA: Diagnosis not present

## 2020-02-19 DIAGNOSIS — I2510 Atherosclerotic heart disease of native coronary artery without angina pectoris: Secondary | ICD-10-CM | POA: Diagnosis not present

## 2020-04-25 DIAGNOSIS — M545 Low back pain: Secondary | ICD-10-CM | POA: Diagnosis not present

## 2020-05-01 DIAGNOSIS — R3912 Poor urinary stream: Secondary | ICD-10-CM | POA: Diagnosis not present

## 2020-05-01 DIAGNOSIS — R3911 Hesitancy of micturition: Secondary | ICD-10-CM | POA: Diagnosis not present

## 2020-05-01 DIAGNOSIS — N401 Enlarged prostate with lower urinary tract symptoms: Secondary | ICD-10-CM | POA: Diagnosis not present

## 2020-05-07 ENCOUNTER — Ambulatory Visit: Payer: Medicare Other | Admitting: Cardiology

## 2020-05-15 DIAGNOSIS — M545 Low back pain: Secondary | ICD-10-CM | POA: Diagnosis not present

## 2020-05-21 ENCOUNTER — Other Ambulatory Visit: Payer: Self-pay

## 2020-05-21 ENCOUNTER — Encounter: Payer: Self-pay | Admitting: Cardiology

## 2020-05-21 ENCOUNTER — Ambulatory Visit: Payer: Medicare Other | Admitting: Cardiology

## 2020-05-21 VITALS — BP 130/78 | HR 65 | Resp 16 | Ht 70.0 in | Wt 167.0 lb

## 2020-05-21 DIAGNOSIS — I2510 Atherosclerotic heart disease of native coronary artery without angina pectoris: Secondary | ICD-10-CM | POA: Diagnosis not present

## 2020-05-21 DIAGNOSIS — I10 Essential (primary) hypertension: Secondary | ICD-10-CM | POA: Diagnosis not present

## 2020-05-21 NOTE — Progress Notes (Signed)
Primary Physician/Referring:  Merrilee Seashore, MD  Patient ID: Calvin Gray, male    DOB: November 29, 1925, 86 y.o.   MRN: [id number]  Chief Complaint  Patient presents with  . Coronary Artery Disease  . Hypertension  . Follow-up    1 year   HPI:    Calvin Gray  is a 86 y.o.   with CAD S/P stenting to Cx in 2013, OSA on CPAP, hyperlipidmia, hypertension, stage III CKD and decreased exercise tolerence presents for f/u.   He has been exercising almost on a regular basis.  Chops wood without problems. Does want to do more and states he gives out. No chest pain. Dyspnea has remained stable. No leg edema.  He has been wearing support stockings regularly.   Past Medical History:  Diagnosis Date  . Bradycardia by electrocardiogram 11/01/2018  . Bradycardia, sinus 02/08/12  . Essential hypertension, benign 11/01/2018  . Exertional dyspnea   . Gait disturbance 11/01/2018  . High cholesterol   . HOH (hard of hearing)   . Hypertension   . Sleep apnea   . Syncope 09/04/2017  . Syncope and collapse 09/04/2017  . Type II diabetes mellitus ([place])    "keeps it controlled w/diet and exercise"   Past Surgical History:  Procedure Laterality Date  . CATARACT EXTRACTION W/ INTRAOCULAR LENS  IMPLANT, BILATERAL    . CORONARY ANGIOPLASTY WITH STENT PLACEMENT  02/07/12   "1"  . LEFT HEART CATHETERIZATION WITH CORONARY ANGIOGRAM N/A 02/08/2012   Procedure: LEFT HEART CATHETERIZATION WITH CORONARY ANGIOGRAM;  Surgeon: Laverda Page, MD;  Location: [facility] CATH LAB;  Service: Cardiovascular;  Laterality: N/A;   Social History   Tobacco Use  . Smoking status: Never Smoker  . Smokeless tobacco: Never Used  Substance Use Topics  . Alcohol use: No   Marital Status: Married   ROS  Review of Systems  Constitutional: Positive for malaise/fatigue.  Cardiovascular: Positive for dyspnea on exertion.  Musculoskeletal: Positive for joint pain.   Objective  Blood pressure 130/78, pulse 65, resp. rate  16, height 5' 10"  (1.778 m), weight 167 lb (75.8 kg), SpO2 96 %. Body mass index is 23.96 kg/m.   Physical Exam Constitutional:      General: He is not in acute distress.    Appearance: He is well-developed.     Comments: Appears younger than stated age  Neck:     Thyroid: No thyromegaly.     Vascular: No JVD.  Cardiovascular:     Rate and Rhythm: Normal rate and regular rhythm.     Pulses: Intact distal pulses.     Heart sounds: Normal heart sounds. No murmur heard.  No gallop.   Pulmonary:     Effort: Pulmonary effort is normal.     Breath sounds: Normal breath sounds.  Abdominal:     General: Bowel sounds are normal.     Palpations: Abdomen is soft.  Musculoskeletal:     Cervical back: Neck supple.    Radiology: No results found.  Laboratory examination:   External labs  HDL 42.000 02/12/2020 LDL-C 80.000 02/12/2020 Triglycerides 53.000 02/12/2020  02/13/2018: RBC 3.4, hemoglobin 12.2, hematocrit 36.2, CBC otherwise normal.  Creatinine 1.7, EGFR 40/49, potassium 4.6, CMP otherwise normal.    Cholesterol 122, triglycerides 50, HDL 38, LDL 74.  Magnesium 1.9.  Vitamin B12 2495.  Medications   Outpatient Medications Prior to Visit  Medication Sig Dispense Refill  . acetaminophen (TYLENOL) 325 MG tablet Take 650 mg by mouth 2 (  two) times daily.    Marland Kitchen alfuzosin (UROXATRAL) 10 MG 24 hr tablet Take 1 tablet (10 mg total) by mouth daily with breakfast. (Patient taking differently: Take 10 mg by mouth daily. ) 300 tablet 0  . amLODipine (NORVASC) 10 MG tablet TAKE 1 TABLET(10 MG) BY MOUTH DAILY 90 tablet 3  . aspirin EC 81 MG tablet Take 81 mg by mouth daily.    . finasteride (PROSCAR) 5 MG tablet Take 5 mg by mouth daily.    . Multiple Vitamin (MULTIVITAMIN) tablet Take 1 tablet by mouth daily.    . pravastatin (PRAVACHOL) 80 MG tablet Take 40 mg by mouth 2 (two) times daily.     . predniSONE (DELTASONE) 10 MG tablet Take by mouth. Tapering dose    . temazepam (RESTORIL) 15 MG  capsule Take 15 mg by mouth at bedtime as needed for sleep.     No facility-administered medications prior to visit.    Cardiac Studies:   CPX 03-Aug-2018:   CPX 2018-08-03: Conclusion: Exercise testing with gas exchange demonstrates mild functional impairment when compared to matched sedentary norms. There was a mild hypertensive response to exercise, however limitations are more likely due to age. Pre-exercise spirometry within normal limits. Overall mild functional limitation based on age-based norms. Suspect due to mild diastolic HF. No evidence of ischemia or pulmonary limitation noted. During exercise had occasional pacing spikes in QRS.  Coronary Angiogram   02/08/2012: Stent Mid Circumflex 3.5x22 Resolute. Residual RI sec branch 80% large.  EKG:  EKG 05/21/2020: Normal sinus rhythm with rate of 62 bpm, leftward axis, nonspecific IVCD, borderline criteria for LVH.  Normal QT interval.  No significant change from 05/07/2019.   Assessment     ICD-10-CM   1. Coronary artery disease involving native coronary artery of native heart without angina pectoris  I25.10 EKG 12-Lead  2. Essential hypertension, benign  I10     Recommendations:    Calvin Gray  is a 86 y.o. with CAD S/P stenting to Cx in 2013, OSA on CPAP, hyperlipidmia, hypertension, stage III CKD and decreased exercise tolerence presents for f/u.   He presents for annual visit and follow-up, he essentially remains asymptomatic, his activity level has increased since last office visit, no clinical evidence of heart failure and his blood pressure is well controlled.  I reviewed his external labs, LDL is at goal.  No changes in the medications were done today.  I have reassured them that he is doing well, that the EKG is remained stable, will continue to see him back on annual basis.   Adrian Prows, MD, [facility] 05/21/2020, 10:46 AM Office: [phone]

## 2020-06-27 DIAGNOSIS — Z23 Encounter for immunization: Secondary | ICD-10-CM | POA: Diagnosis not present

## 2020-07-02 DIAGNOSIS — Z23 Encounter for immunization: Secondary | ICD-10-CM | POA: Diagnosis not present

## 2020-07-15 DIAGNOSIS — M545 Low back pain, unspecified: Secondary | ICD-10-CM | POA: Diagnosis not present

## 2020-07-21 ENCOUNTER — Ambulatory Visit (INDEPENDENT_AMBULATORY_CARE_PROVIDER_SITE_OTHER): Payer: Medicare Other | Admitting: Pulmonary Disease

## 2020-07-21 ENCOUNTER — Other Ambulatory Visit: Payer: Self-pay

## 2020-07-21 ENCOUNTER — Encounter: Payer: Self-pay | Admitting: Pulmonary Disease

## 2020-07-21 DIAGNOSIS — I2510 Atherosclerotic heart disease of native coronary artery without angina pectoris: Secondary | ICD-10-CM

## 2020-07-21 DIAGNOSIS — G4731 Primary central sleep apnea: Secondary | ICD-10-CM

## 2020-07-21 NOTE — Progress Notes (Signed)
Subjective:    Patient ID: Calvin Gray, male    DOB: 06-06-1926, 86 y.o.   MRN: [id number]  HPI  86 year old with known OSA presents to reestablish care. His CPAP machine stopped working last night and he needs a replacement.  I note that he was seen at our practice years ago. He was started on auto CPAP in 2013 after baseline sleep study showed predominantly OSA few central events. He had good results and download showed good control of events with few residual central apneas, presumably treatment emergent central apneas but he was symptomatically improved. He was very compliant with his machine and it settled down with his full facemask. His machine suddenly stopped working last night. He called his DME with suggested a replacement rather than repair. Epworth sleepiness score is 8 and he reports some sleepiness in the afternoons. Bedtime is between 11 PM and midnight, sleep latency is minimal, he sleeps on his side with 1 pillow, reports 2 to more nocturnal awakenings including nocturia is out of bed at 6 AM feeling rested without dryness of mouth or headaches. His weight is mostly unchanged over the past few years  He is on prednisone for back pain  pmh -hypertension controlled with medication  Significant tests/ events reviewed  NPSG 06/2012:  Central apneas present.  Obstructive sleep apnea with AHI 50/hr On autocpap 5-12 cm water.   Past Medical History:  Diagnosis Date  . Bradycardia by electrocardiogram 11/01/2018  . Bradycardia, sinus 02/08/12  . Essential hypertension, benign 11/01/2018  . Exertional dyspnea   . Gait disturbance 11/01/2018  . High cholesterol   . HOH (hard of hearing)   . Hypertension   . Sleep apnea   . Syncope 09/04/2017  . Syncope and collapse 09/04/2017  . Type II diabetes mellitus ([place])    "keeps it controlled w/diet and exercise"    Past Surgical History:  Procedure Laterality Date  . CATARACT EXTRACTION W/ INTRAOCULAR LENS  IMPLANT, BILATERAL     . CORONARY ANGIOPLASTY WITH STENT PLACEMENT  02/07/12   "1"  . LEFT HEART CATHETERIZATION WITH CORONARY ANGIOGRAM N/A 02/08/2012   Procedure: LEFT HEART CATHETERIZATION WITH CORONARY ANGIOGRAM;  Surgeon: Laverda Page, MD;  Location: [facility] CATH LAB;  Service: Cardiovascular;  Laterality: N/A;    No Known Allergies  Social History   Socioeconomic History  . Marital status: Married    Spouse name: Not on file  . Number of children: 3  . Years of education: Not on file  . Highest education level: Not on file  Occupational History  . Occupation: retired  Tobacco Use  . Smoking status: Never Smoker  . Smokeless tobacco: Never Used  Vaping Use  . Vaping Use: Never used  Substance and Sexual Activity  . Alcohol use: No  . Drug use: No  . Sexual activity: Not Currently  Other Topics Concern  . Not on file  Social History Narrative  . Not on file   Social Determinants of Health   Financial Resource Strain:   . Difficulty of Paying Living Expenses: Not on file  Food Insecurity:   . Worried About Charity fundraiser in the Last Year: Not on file  . Ran Out of Food in the Last Year: Not on file  Transportation Needs:   . Lack of Transportation (Medical): Not on file  . Lack of Transportation (Non-Medical): Not on file  Physical Activity:   . Days of Exercise per Week: Not on file  .  Minutes of Exercise per Session: Not on file  Stress:   . Feeling of Stress : Not on file  Social Connections:   . Frequency of Communication with Friends and Family: Not on file  . Frequency of Social Gatherings with Friends and Family: Not on file  . Attends Religious Services: Not on file  . Active Member of Clubs or Organizations: Not on file  . Attends Archivist Meetings: Not on file  . Marital Status: Not on file  Intimate Partner Violence:   . Fear of Current or Ex-Partner: Not on file  . Emotionally Abused: Not on file  . Physically Abused: Not on file  . Sexually  Abused: Not on file      Review of Systems Shortness of breath with activity Joint stiffness Back pain, right hip pain      Objective:   Physical Exam  Gen. Pleasant, well-nourished, elderly, in no distress ENT - no thrush, no pallor/icterus,no post nasal drip, hard of hearing Neck: No JVD, no thyromegaly, no carotid bruits Lungs: no use of accessory muscles, no dullness to percussion, clear without rales or rhonchi  Cardiovascular: Rhythm regular, heart sounds  normal, no murmurs or gallops, no peripheral edema Musculoskeletal: No deformities, no cyanosis or clubbing        Assessment & Plan:

## 2020-07-21 NOTE — Assessment & Plan Note (Signed)
Appears to be treatment emergent central apneas. Prior downloads have shown good control of events with residual AHI of about 7/hours mainly centrals on auto CPAP 5 to 12 cm with average pressure of 11 cm. His machine stopped working last night. We will go ahead and get him on replacement auto CPAP similar settings 5 to 12 cm. He is very compliant and CPAP is certainly helped improve his daytime somnolence and fatigue   compliance with goal of at least 4-6 hrs every night is the expectation. Advised against medications with sedative side effects Cautioned against driving when sleepy - understanding that sleepiness will vary on a day to day basis

## 2020-07-21 NOTE — Patient Instructions (Signed)
Prescription for auto CPAP 5 to 12 cm will be sent to DME

## 2020-08-12 DIAGNOSIS — M5459 Other low back pain: Secondary | ICD-10-CM | POA: Diagnosis not present

## 2020-09-08 DIAGNOSIS — M545 Low back pain, unspecified: Secondary | ICD-10-CM | POA: Diagnosis not present

## 2020-09-15 DIAGNOSIS — M545 Low back pain, unspecified: Secondary | ICD-10-CM | POA: Diagnosis not present

## 2020-09-17 DIAGNOSIS — E782 Mixed hyperlipidemia: Secondary | ICD-10-CM | POA: Diagnosis not present

## 2020-09-17 DIAGNOSIS — E1169 Type 2 diabetes mellitus with other specified complication: Secondary | ICD-10-CM | POA: Diagnosis not present

## 2020-09-17 DIAGNOSIS — G4733 Obstructive sleep apnea (adult) (pediatric): Secondary | ICD-10-CM | POA: Diagnosis not present

## 2020-09-17 DIAGNOSIS — Z0000 Encounter for general adult medical examination without abnormal findings: Secondary | ICD-10-CM | POA: Diagnosis not present

## 2020-09-17 DIAGNOSIS — I2510 Atherosclerotic heart disease of native coronary artery without angina pectoris: Secondary | ICD-10-CM | POA: Diagnosis not present

## 2020-09-17 DIAGNOSIS — I129 Hypertensive chronic kidney disease with stage 1 through stage 4 chronic kidney disease, or unspecified chronic kidney disease: Secondary | ICD-10-CM | POA: Diagnosis not present

## 2020-09-24 DIAGNOSIS — N400 Enlarged prostate without lower urinary tract symptoms: Secondary | ICD-10-CM | POA: Diagnosis not present

## 2020-09-24 DIAGNOSIS — I2510 Atherosclerotic heart disease of native coronary artery without angina pectoris: Secondary | ICD-10-CM | POA: Diagnosis not present

## 2020-09-24 DIAGNOSIS — I130 Hypertensive heart and chronic kidney disease with heart failure and stage 1 through stage 4 chronic kidney disease, or unspecified chronic kidney disease: Secondary | ICD-10-CM | POA: Diagnosis not present

## 2020-09-24 DIAGNOSIS — I129 Hypertensive chronic kidney disease with stage 1 through stage 4 chronic kidney disease, or unspecified chronic kidney disease: Secondary | ICD-10-CM | POA: Diagnosis not present

## 2020-09-24 DIAGNOSIS — E1169 Type 2 diabetes mellitus with other specified complication: Secondary | ICD-10-CM | POA: Diagnosis not present

## 2020-09-24 DIAGNOSIS — E782 Mixed hyperlipidemia: Secondary | ICD-10-CM | POA: Diagnosis not present

## 2020-09-24 DIAGNOSIS — G4733 Obstructive sleep apnea (adult) (pediatric): Secondary | ICD-10-CM | POA: Diagnosis not present

## 2020-09-24 DIAGNOSIS — N1832 Chronic kidney disease, stage 3b: Secondary | ICD-10-CM | POA: Diagnosis not present

## 2020-09-24 DIAGNOSIS — Z23 Encounter for immunization: Secondary | ICD-10-CM | POA: Diagnosis not present

## 2020-10-02 DIAGNOSIS — M5416 Radiculopathy, lumbar region: Secondary | ICD-10-CM | POA: Diagnosis not present

## 2020-10-13 ENCOUNTER — Other Ambulatory Visit: Payer: Self-pay | Admitting: Cardiology

## 2020-10-13 DIAGNOSIS — I10 Essential (primary) hypertension: Secondary | ICD-10-CM

## 2020-10-16 DIAGNOSIS — M7061 Trochanteric bursitis, right hip: Secondary | ICD-10-CM | POA: Diagnosis not present

## 2020-10-16 DIAGNOSIS — M5459 Other low back pain: Secondary | ICD-10-CM | POA: Diagnosis not present

## 2020-10-21 ENCOUNTER — Ambulatory Visit: Payer: Medicare Other | Admitting: Pulmonary Disease

## 2020-11-10 DIAGNOSIS — E1122 Type 2 diabetes mellitus with diabetic chronic kidney disease: Secondary | ICD-10-CM | POA: Diagnosis not present

## 2020-11-10 DIAGNOSIS — E782 Mixed hyperlipidemia: Secondary | ICD-10-CM | POA: Diagnosis not present

## 2020-11-10 DIAGNOSIS — N1832 Chronic kidney disease, stage 3b: Secondary | ICD-10-CM | POA: Diagnosis not present

## 2020-11-10 DIAGNOSIS — I129 Hypertensive chronic kidney disease with stage 1 through stage 4 chronic kidney disease, or unspecified chronic kidney disease: Secondary | ICD-10-CM | POA: Diagnosis not present

## 2020-12-11 DIAGNOSIS — I129 Hypertensive chronic kidney disease with stage 1 through stage 4 chronic kidney disease, or unspecified chronic kidney disease: Secondary | ICD-10-CM | POA: Diagnosis not present

## 2020-12-11 DIAGNOSIS — E782 Mixed hyperlipidemia: Secondary | ICD-10-CM | POA: Diagnosis not present

## 2020-12-11 DIAGNOSIS — N1832 Chronic kidney disease, stage 3b: Secondary | ICD-10-CM | POA: Diagnosis not present

## 2020-12-11 DIAGNOSIS — E1122 Type 2 diabetes mellitus with diabetic chronic kidney disease: Secondary | ICD-10-CM | POA: Diagnosis not present

## 2020-12-17 ENCOUNTER — Other Ambulatory Visit: Payer: Self-pay

## 2020-12-17 ENCOUNTER — Ambulatory Visit (INDEPENDENT_AMBULATORY_CARE_PROVIDER_SITE_OTHER): Payer: Medicare Other | Admitting: Pulmonary Disease

## 2020-12-17 ENCOUNTER — Encounter: Payer: Self-pay | Admitting: Pulmonary Disease

## 2020-12-17 DIAGNOSIS — I10 Essential (primary) hypertension: Secondary | ICD-10-CM | POA: Diagnosis not present

## 2020-12-17 DIAGNOSIS — G4731 Primary central sleep apnea: Secondary | ICD-10-CM

## 2020-12-17 NOTE — Assessment & Plan Note (Addendum)
Download was reviewed and this shows residual AHI of 8/hour but central apneas seem controlled, average pressure is 11.2 with maximum flow 1.7 cm. He has a large leak. We discussed that his straps need to be tightened to get a better seal and I think that should fix the problem. Do not feel the need to increase the pressure especially given that he has had treatment emergent central apneas in the past. He seems to be very compliant and CPAP is certainly helped improve his daytime somnolence and fatigue.  If leak persists, he will call us back and we can send him for CPAP desensitization visit

## 2020-12-17 NOTE — Patient Instructions (Signed)
CPAP is working well Have to tighten straps to decrease leak If leak persists, call us back

## 2020-12-17 NOTE — Progress Notes (Signed)
   Subjective:    Patient ID: Calvin Gray, male    DOB: 10-29-1925, 87 y.o.   MRN: [id number]  HPI   87 year old with OSA and treatment emergent central apneas. Last visit 05/2020 regarding 1 use auto CPAP machine 5 to 12 cm  Chief Complaint  Patient presents with  . Follow-up    No complaints. Wife feels like too much air may be leaking   It took him a few months when he finally got this a few weeks ago.  He is settling down with this.  Wife has noticed a leak This is confirmed on download. He has a new mask with magnetic straps and is having some difficulty with this No problems with pressure, he wonders if the pressure increases beyond the display of 4.0 cm  Significant tests/ events reviewed  NPSG 06/2012: Central apneas present. Obstructive sleep apnea with AHI 50/hr On autocpap 5-12 cm water.  Review of Systems neg for any significant sore throat, dysphagia, itching, sneezing, nasal congestion or excess/ purulent secretions, fever, chills, sweats, unintended wt loss, pleuritic or exertional cp, hempoptysis, orthopnea pnd or change in chronic leg swelling. Also denies presyncope, palpitations, heartburn, abdominal pain, nausea, vomiting, diarrhea or change in bowel or urinary habits, dysuria,hematuria, rash, arthralgias, visual complaints, headache, numbness weakness or ataxia.     Objective:   Physical Exam  Gen. Pleasant, well-nourished, elderly, in no distress ENT - no thrush, no pallor/icterus,no post nasal drip Neck: No JVD, no thyromegaly, no carotid bruits Lungs: no use of accessory muscles, no dullness to percussion, clear without rales or rhonchi  Cardiovascular: Rhythm regular, heart sounds  normal, no murmurs or gallops, no peripheral edema Musculoskeletal: No deformities, no cyanosis or clubbing         Assessment & Plan:

## 2020-12-17 NOTE — Assessment & Plan Note (Signed)
Controlled.  

## 2021-02-10 DIAGNOSIS — E782 Mixed hyperlipidemia: Secondary | ICD-10-CM | POA: Diagnosis not present

## 2021-02-10 DIAGNOSIS — I129 Hypertensive chronic kidney disease with stage 1 through stage 4 chronic kidney disease, or unspecified chronic kidney disease: Secondary | ICD-10-CM | POA: Diagnosis not present

## 2021-02-10 DIAGNOSIS — N1832 Chronic kidney disease, stage 3b: Secondary | ICD-10-CM | POA: Diagnosis not present

## 2021-02-10 DIAGNOSIS — E1122 Type 2 diabetes mellitus with diabetic chronic kidney disease: Secondary | ICD-10-CM | POA: Diagnosis not present

## 2021-03-23 DIAGNOSIS — H43813 Vitreous degeneration, bilateral: Secondary | ICD-10-CM | POA: Diagnosis not present

## 2021-03-23 DIAGNOSIS — H524 Presbyopia: Secondary | ICD-10-CM | POA: Diagnosis not present

## 2021-03-23 DIAGNOSIS — H16223 Keratoconjunctivitis sicca, not specified as Sjogren's, bilateral: Secondary | ICD-10-CM | POA: Diagnosis not present

## 2021-03-23 DIAGNOSIS — H353131 Nonexudative age-related macular degeneration, bilateral, early dry stage: Secondary | ICD-10-CM | POA: Diagnosis not present

## 2021-03-23 DIAGNOSIS — E1169 Type 2 diabetes mellitus with other specified complication: Secondary | ICD-10-CM | POA: Diagnosis not present

## 2021-03-30 DIAGNOSIS — N1832 Chronic kidney disease, stage 3b: Secondary | ICD-10-CM | POA: Diagnosis not present

## 2021-03-30 DIAGNOSIS — E1169 Type 2 diabetes mellitus with other specified complication: Secondary | ICD-10-CM | POA: Diagnosis not present

## 2021-03-30 DIAGNOSIS — I130 Hypertensive heart and chronic kidney disease with heart failure and stage 1 through stage 4 chronic kidney disease, or unspecified chronic kidney disease: Secondary | ICD-10-CM | POA: Diagnosis not present

## 2021-04-23 DIAGNOSIS — H9113 Presbycusis, bilateral: Secondary | ICD-10-CM | POA: Diagnosis not present

## 2021-04-23 DIAGNOSIS — R1314 Dysphagia, pharyngoesophageal phase: Secondary | ICD-10-CM | POA: Diagnosis not present

## 2021-04-27 DIAGNOSIS — I130 Hypertensive heart and chronic kidney disease with heart failure and stage 1 through stage 4 chronic kidney disease, or unspecified chronic kidney disease: Secondary | ICD-10-CM | POA: Diagnosis not present

## 2021-04-27 DIAGNOSIS — E1169 Type 2 diabetes mellitus with other specified complication: Secondary | ICD-10-CM | POA: Diagnosis not present

## 2021-04-27 DIAGNOSIS — N1832 Chronic kidney disease, stage 3b: Secondary | ICD-10-CM | POA: Diagnosis not present

## 2021-05-04 DIAGNOSIS — N1832 Chronic kidney disease, stage 3b: Secondary | ICD-10-CM | POA: Diagnosis not present

## 2021-05-04 DIAGNOSIS — E782 Mixed hyperlipidemia: Secondary | ICD-10-CM | POA: Diagnosis not present

## 2021-05-04 DIAGNOSIS — I130 Hypertensive heart and chronic kidney disease with heart failure and stage 1 through stage 4 chronic kidney disease, or unspecified chronic kidney disease: Secondary | ICD-10-CM | POA: Diagnosis not present

## 2021-05-07 DIAGNOSIS — N401 Enlarged prostate with lower urinary tract symptoms: Secondary | ICD-10-CM | POA: Diagnosis not present

## 2021-05-07 DIAGNOSIS — R351 Nocturia: Secondary | ICD-10-CM | POA: Diagnosis not present

## 2021-05-13 DIAGNOSIS — N1832 Chronic kidney disease, stage 3b: Secondary | ICD-10-CM | POA: Diagnosis not present

## 2021-05-13 DIAGNOSIS — I129 Hypertensive chronic kidney disease with stage 1 through stage 4 chronic kidney disease, or unspecified chronic kidney disease: Secondary | ICD-10-CM | POA: Diagnosis not present

## 2021-05-13 DIAGNOSIS — E1122 Type 2 diabetes mellitus with diabetic chronic kidney disease: Secondary | ICD-10-CM | POA: Diagnosis not present

## 2021-05-13 DIAGNOSIS — E782 Mixed hyperlipidemia: Secondary | ICD-10-CM | POA: Diagnosis not present

## 2021-05-21 ENCOUNTER — Ambulatory Visit: Payer: Medicare Other | Admitting: Cardiology

## 2021-05-21 ENCOUNTER — Other Ambulatory Visit: Payer: Self-pay

## 2021-05-21 ENCOUNTER — Encounter: Payer: Self-pay | Admitting: Cardiology

## 2021-05-21 VITALS — BP 127/65 | HR 72 | Temp 98.1°F | Resp 17 | Ht 70.0 in | Wt 162.6 lb

## 2021-05-21 DIAGNOSIS — N1832 Chronic kidney disease, stage 3b: Secondary | ICD-10-CM | POA: Diagnosis not present

## 2021-05-21 DIAGNOSIS — I129 Hypertensive chronic kidney disease with stage 1 through stage 4 chronic kidney disease, or unspecified chronic kidney disease: Secondary | ICD-10-CM | POA: Diagnosis not present

## 2021-05-21 DIAGNOSIS — M545 Low back pain, unspecified: Secondary | ICD-10-CM

## 2021-05-21 DIAGNOSIS — E7800 Pure hypercholesterolemia, unspecified: Secondary | ICD-10-CM

## 2021-05-21 DIAGNOSIS — I10 Essential (primary) hypertension: Secondary | ICD-10-CM

## 2021-05-21 DIAGNOSIS — I2510 Atherosclerotic heart disease of native coronary artery without angina pectoris: Secondary | ICD-10-CM | POA: Diagnosis not present

## 2021-05-21 NOTE — Progress Notes (Signed)
Primary Physician/Referring:  Georgianne Fick, MD  Patient ID: Calvin Gray, male    DOB: 05/09/1926, 87 y.o.   MRN: [id number]  Chief Complaint  Patient presents with   Follow-up    1 YEAR   Coronary Artery Disease   Hypertension   HPI:    Calvin Gray  is a 87 y.o.  with CAD S/P stenting to Cx in 2013, OSA on CPAP, hyperlipidmia, hypertension, stage IIIb CKD and decreased exercise tolerence presents for annual F/u.  His symptoms of fatigue is improved, he has no dyspnea or has not had any recurrence of chest pain his main complaint being back pain and right hip pain.  No leg edema.  He has been wearing support stockings regularly.   Past Medical History:  Diagnosis Date   Bradycardia by electrocardiogram 11/01/2018   Bradycardia, sinus 02/08/12   Essential hypertension, benign 11/01/2018   Exertional dyspnea    Gait disturbance 11/01/2018   High cholesterol    HOH (hard of hearing)    Hypertension    Sleep apnea    Syncope 09/04/2017   Syncope and collapse 09/04/2017   Type II diabetes mellitus ([facility])    "keeps it controlled w/diet and exercise"   Past Surgical History:  Procedure Laterality Date   CATARACT EXTRACTION W/ INTRAOCULAR LENS  IMPLANT, BILATERAL     CORONARY ANGIOPLASTY WITH STENT PLACEMENT  02/07/12   "1"   LEFT HEART CATHETERIZATION WITH CORONARY ANGIOGRAM N/A 02/08/2012   Procedure: LEFT HEART CATHETERIZATION WITH CORONARY ANGIOGRAM;  Surgeon: Pamella Pert, MD;  Location: [facility] CATH LAB;  Service: Cardiovascular;  Laterality: N/A;   Social History   Tobacco Use   Smoking status: Never   Smokeless tobacco: Never  Substance Use Topics   Alcohol use: No   Marital Status: Married   ROS  Review of Systems  Constitutional: Negative for malaise/fatigue.  Cardiovascular:  Negative for dyspnea on exertion.  Musculoskeletal:  Positive for back pain and joint pain (right hip).  Objective  Blood pressure 127/65, pulse 72, temperature 98.1 F (36.7  C), temperature source Temporal, resp. rate 17, height 5\' 10"  (1.778 m), weight 162 lb 9.6 oz (73.8 kg), SpO2 99 %. Body mass index is 23.33 kg/m.   Physical Exam Constitutional:      General: He is not in acute distress.    Appearance: He is well-developed.     Comments: Appears younger than stated age  Neck:     Thyroid: No thyromegaly.     Vascular: No JVD.  Cardiovascular:     Rate and Rhythm: Normal rate and regular rhythm.     Pulses: Intact distal pulses.     Heart sounds: Normal heart sounds. No murmur heard.   No gallop.  Pulmonary:     Effort: Pulmonary effort is normal.     Breath sounds: Normal breath sounds.  Abdominal:     General: Bowel sounds are normal.     Palpations: Abdomen is soft.  Musculoskeletal:     Cervical back: Neck supple.   Radiology: No results found.  Laboratory examination:   External labs  Cholesterol, total 127.000 m 03/23/2021 HDL 38.000 mg 03/23/2021 LDL 91.000 mg 09/17/2020 Triglycerides 58.000 mg 03/23/2021  Labs 09/17/2020:  Hb 12.0/HCT 36.1, platelets 161, normal indicis.  Serum glucose 106 mg, BUN 22, creatinine 1.47, EGFR 41 mL, potassium 4.4, CMP otherwise normal.  A1c 6.2%. TSH normal at 1.40.  Total cholesterol 146, triglycerides 91, HDL 38, LDL 91.  Medications  Outpatient Medications Prior to Visit  Medication Sig Dispense Refill   acetaminophen (TYLENOL) 325 MG tablet Take 650 mg by mouth as needed.     alfuzosin (UROXATRAL) 10 MG 24 hr tablet Take 1 tablet (10 mg total) by mouth daily with breakfast. (Patient taking differently: Take 10 mg by mouth daily.) 300 tablet 0   amLODipine (NORVASC) 10 MG tablet TAKE 1 TABLET(10 MG) BY MOUTH DAILY 90 tablet 3   aspirin EC 81 MG tablet Take 81 mg by mouth daily.     finasteride (PROSCAR) 5 MG tablet Take 5 mg by mouth daily.     irbesartan (AVAPRO) 75 MG tablet Take 75 mg by mouth daily.     Multiple Vitamin (MULTIVITAMIN) tablet Take 1 tablet by mouth daily.      pravastatin (PRAVACHOL) 80 MG tablet Take 40 mg by mouth 2 (two) times daily.      tetrahydrozoline 0.05 % ophthalmic solution Place 1 drop into both eyes daily.     predniSONE (DELTASONE) 10 MG tablet Take by mouth. Tapering dose (Patient not taking: Reported on 12/17/2020)     No facility-administered medications prior to visit.    Cardiac Studies:   CPX August 22, 2018:   CPX 08-22-2018: Conclusion: Exercise testing with gas exchange demonstrates mild functional impairment when compared to matched sedentary norms. There was a mild hypertensive response to exercise, however limitations are more likely due to age. Pre-exercise spirometry within normal limits. Overall mild functional limitation based on age-based norms. Suspect due to mild diastolic HF. No evidence of ischemia or pulmonary limitation noted. During exercise had occasional pacing spikes in QRS.  Coronary Angiogram   02/08/2012: Stent Mid Circumflex 3.5x22 Resolute. Residual RI sec branch 80% large.  EKG:  EKG 05/21/2021: Normal sinus rhythm at rate of 65 bpm, normal axis, IVCD, borderline criteria for LVH.  No significant change from 05/21/2020.   Assessment     ICD-10-CM   1. Essential hypertension, benign  I10 EKG 12-Lead    2. Coronary artery disease involving native coronary artery of native heart without angina pectoris  I25.10     3. Hypercholesteremia  E78.00     4. Acute right-sided low back pain without sciatica  M54.50     5. Stage 3b chronic kidney disease ([place])  N18.32       Recommendations:    Calvin Gray  is a 87 y.o. with CAD S/P stenting to Cx in 2013, OSA on CPAP, hyperlipidmia, hypertension, stage IIIb CKD and decreased exercise tolerence presents for annual F/u.  His symptoms of fatigue is improved, he has no dyspnea or has not had any recurrence of chest pain his main complaint being back pain and right hip pain.  His vascular examination is completely normal and his EKG is unchanged and blood pressure  is under excellent control.  I also reviewed his labs, although his LDL is not <70, he is presently 87 years of age and has done well without recurrence of ACS or angina pectoris, hence continue high-dose low intensity statin, pravastatin 80 mg daily.  No change in his physical exam, no tests were ordered.  With regard to his back pain I have advised him that if the surgeon decides to operate on him that he would be low risk from cardiac standpoint.  I will see him back on an annual basis.  External labs reviewed.  Renal function has remained stable and he is tolerating Avapro.   Adrian Prows, MD, [facility] 05/21/2021, 11:38 AM Office:  [phone]  

## 2021-06-18 ENCOUNTER — Ambulatory Visit: Payer: Medicare Other | Admitting: Adult Health

## 2021-06-19 ENCOUNTER — Other Ambulatory Visit: Payer: Self-pay

## 2021-06-19 ENCOUNTER — Ambulatory Visit (INDEPENDENT_AMBULATORY_CARE_PROVIDER_SITE_OTHER): Payer: Medicare Other | Admitting: Adult Health

## 2021-06-19 ENCOUNTER — Encounter: Payer: Self-pay | Admitting: Adult Health

## 2021-06-19 VITALS — BP 112/60 | HR 77 | Temp 98.3°F | Ht 70.0 in | Wt 161.2 lb

## 2021-06-19 DIAGNOSIS — I2510 Atherosclerotic heart disease of native coronary artery without angina pectoris: Secondary | ICD-10-CM | POA: Diagnosis not present

## 2021-06-19 DIAGNOSIS — G4731 Primary central sleep apnea: Secondary | ICD-10-CM

## 2021-06-19 DIAGNOSIS — G4733 Obstructive sleep apnea (adult) (pediatric): Secondary | ICD-10-CM

## 2021-06-19 NOTE — Progress Notes (Signed)
@Patient  ID: Calvin Gray, male    DOB: July 08, 1926, 87 y.o.   MRN: [id number]  Chief Complaint  Patient presents with   Follow-up    Referring provider: Merrilee Seashore, MD  HPI: 87 year old male followed for obstructive sleep apnea with treatment emergent central apneas  TEST/EVENTS :  NPSG 06/2012:  Central apneas present.  Obstructive sleep apnea with AHI 50/hr On autocpap 5-12 cm water.    06/19/21 Follow up : Complex sleep apnea  Patient returns for a 46-month follow-up.  Patient has underlying complex sleep apnea.  He has had severe obstructive sleep apnea on previous sleep study.  He was started on CPAP currently on 5 to 12 cm H2O.  He had treatment emergent central apneas.  Typically averages around 4-7 events an hour with predominant central events .  Patient says he wears a CPAP all night long.  Patient's mask does leak.  But he says he does not wake him up. CPAP download shows excellent compliance with 100% usage.  Daily average usage at 6.5 hours.  AHI 11.6 (predominant central events).  Positive mask leaks. Patient education on sleep apnea and CPAP usage.  No Known Allergies  Immunization History  Administered Date(s) Administered   Influenza Split 09/13/2010, 07/02/2012   Influenza, High Dose Seasonal PF 07/08/2018   Influenza,inj,Quad PF,6+ Mos 06/13/2013, 06/14/2015   Influenza-Unspecified 06/13/2014, 08/13/2018   PFIZER(Purple Top)SARS-COV-2 Vaccination 10/08/2019, 10/29/2019, 07/02/2020    Past Medical History:  Diagnosis Date   Bradycardia by electrocardiogram 11/01/2018   Bradycardia, sinus 02/08/12   Essential hypertension, benign 11/01/2018   Exertional dyspnea    Gait disturbance 11/01/2018   High cholesterol    HOH (hard of hearing)    Hypertension    Sleep apnea    Syncope 09/04/2017   Syncope and collapse 09/04/2017   Type II diabetes mellitus ([place])    "keeps it controlled w/diet and exercise"    Tobacco History: Social History   Tobacco  Use  Smoking Status Never  Smokeless Tobacco Never   Counseling given: Not Answered   Outpatient Medications Prior to Visit  Medication Sig Dispense Refill   acetaminophen (TYLENOL) 325 MG tablet Take 650 mg by mouth as needed.     alfuzosin (UROXATRAL) 10 MG 24 hr tablet Take 1 tablet (10 mg total) by mouth daily with breakfast. (Patient taking differently: Take 10 mg by mouth daily.) 300 tablet 0   amLODipine (NORVASC) 10 MG tablet TAKE 1 TABLET(10 MG) BY MOUTH DAILY 90 tablet 3   aspirin EC 81 MG tablet Take 81 mg by mouth daily.     finasteride (PROSCAR) 5 MG tablet Take 5 mg by mouth daily.     irbesartan (AVAPRO) 75 MG tablet Take 75 mg by mouth daily.     Multiple Vitamin (MULTIVITAMIN) tablet Take 1 tablet by mouth daily.     pravastatin (PRAVACHOL) 80 MG tablet Take 80 mg by mouth daily.     tetrahydrozoline 0.05 % ophthalmic solution Place 1 drop into both eyes daily.     No facility-administered medications prior to visit.     Review of Systems:   Constitutional:   No  weight loss, night sweats,  Fevers, chills,  +fatigue, or  lassitude.  HEENT:   No headaches,  Difficulty swallowing,  Tooth/dental problems, or  Sore throat,                No sneezing, itching, ear ache, nasal congestion, post nasal drip,   CV:  No  chest pain,  Orthopnea, PND, swelling in lower extremities, anasarca, dizziness, palpitations, syncope.   GI  No heartburn, indigestion, abdominal pain, nausea, vomiting, diarrhea, change in bowel habits, loss of appetite, bloody stools.   Resp: No shortness of breath with exertion or at rest.  No excess mucus, no productive cough,  No non-productive cough,  No coughing up of blood.  No change in color of mucus.  No wheezing.  No chest wall deformity  Skin: no rash or lesions.  GU: no dysuria, change in color of urine, no urgency or frequency.  No flank pain, no hematuria   MS:  No joint pain or swelling.  No decreased range of motion.  No back  pain.    Physical Exam  BP 112/60 (BP Location: Left Arm, Patient Position: Sitting, Cuff Size: Normal)   Pulse 77   Temp 98.3 F (36.8 C) (Oral)   Ht 5\' 10"  (1.778 m)   Wt 161 lb 3.2 oz (73.1 kg)   SpO2 100%   BMI 23.13 kg/m   GEN: A/Ox3; pleasant , NAD, well nourished    HEENT:  [place]/AT,   NOSE-clear, THROAT-clear, no lesions, no postnasal drip or exudate noted.   NECK:  Supple w/ fair ROM; no JVD; normal carotid impulses w/o bruits; no thyromegaly or nodules palpated; no lymphadenopathy.    RESP  Clear  P & A; w/o, wheezes/ rales/ or rhonchi. no accessory muscle use, no dullness to percussion  CARD:  RRR, no m/r/g, tr peripheral edema, pulses intact, no cyanosis or clubbing.  GI:   Soft & nt; nml bowel sounds; no organomegaly or masses detected.   Musco: Warm bil, no deformities or joint swelling noted.   Neuro: alert, no focal deficits noted.    Skin: Warm, no lesions or rashes    Lab Results:  CBC   BNP No results found for: BNP  ProBNP No results found for: PROBNP  Imaging: No results found.    No flowsheet data found.  No results found for: NITRICOXIDE      Assessment & Plan:   No problem-specific Assessment & Plan notes found for this encounter.     Rexene Edison, NP 06/19/2021

## 2021-06-19 NOTE — Patient Instructions (Signed)
Continue on CPAP At bedtime   Refer to [facility] for Mask fitting  Wear CPAP all night long . Do not drive if sleepy  CPAP download in 6 weeks  Follow up with Dr. Elsworth Soho in 6 months and As needed

## 2021-06-22 NOTE — Assessment & Plan Note (Signed)
Patient with increased events predominantly central events., continues to have significant mask leak. Refer for mask fitting, will recheck download in 6 weeks to see if any improvement is made.  Plan  Patient Instructions  Continue on CPAP At bedtime   Refer to [facility] for Mask fitting  Wear CPAP all night long . Do not drive if sleepy  CPAP download in 6 weeks  Follow up with Dr. Elsworth Soho in 6 months and As needed

## 2021-06-29 DIAGNOSIS — E782 Mixed hyperlipidemia: Secondary | ICD-10-CM | POA: Diagnosis not present

## 2021-06-29 DIAGNOSIS — N1832 Chronic kidney disease, stage 3b: Secondary | ICD-10-CM | POA: Diagnosis not present

## 2021-06-29 DIAGNOSIS — I130 Hypertensive heart and chronic kidney disease with heart failure and stage 1 through stage 4 chronic kidney disease, or unspecified chronic kidney disease: Secondary | ICD-10-CM | POA: Diagnosis not present

## 2021-07-01 DIAGNOSIS — H16223 Keratoconjunctivitis sicca, not specified as Sjogren's, bilateral: Secondary | ICD-10-CM | POA: Diagnosis not present

## 2021-07-06 DIAGNOSIS — E782 Mixed hyperlipidemia: Secondary | ICD-10-CM | POA: Diagnosis not present

## 2021-07-06 DIAGNOSIS — I130 Hypertensive heart and chronic kidney disease with heart failure and stage 1 through stage 4 chronic kidney disease, or unspecified chronic kidney disease: Secondary | ICD-10-CM | POA: Diagnosis not present

## 2021-07-06 DIAGNOSIS — E1169 Type 2 diabetes mellitus with other specified complication: Secondary | ICD-10-CM | POA: Diagnosis not present

## 2021-07-06 DIAGNOSIS — Z23 Encounter for immunization: Secondary | ICD-10-CM | POA: Diagnosis not present

## 2021-07-06 DIAGNOSIS — I129 Hypertensive chronic kidney disease with stage 1 through stage 4 chronic kidney disease, or unspecified chronic kidney disease: Secondary | ICD-10-CM | POA: Diagnosis not present

## 2021-07-06 DIAGNOSIS — N1832 Chronic kidney disease, stage 3b: Secondary | ICD-10-CM | POA: Diagnosis not present

## 2021-07-13 DIAGNOSIS — I129 Hypertensive chronic kidney disease with stage 1 through stage 4 chronic kidney disease, or unspecified chronic kidney disease: Secondary | ICD-10-CM | POA: Diagnosis not present

## 2021-07-13 DIAGNOSIS — E1122 Type 2 diabetes mellitus with diabetic chronic kidney disease: Secondary | ICD-10-CM | POA: Diagnosis not present

## 2021-07-13 DIAGNOSIS — N1832 Chronic kidney disease, stage 3b: Secondary | ICD-10-CM | POA: Diagnosis not present

## 2021-07-13 DIAGNOSIS — E782 Mixed hyperlipidemia: Secondary | ICD-10-CM | POA: Diagnosis not present

## 2021-08-12 DIAGNOSIS — I129 Hypertensive chronic kidney disease with stage 1 through stage 4 chronic kidney disease, or unspecified chronic kidney disease: Secondary | ICD-10-CM | POA: Diagnosis not present

## 2021-08-12 DIAGNOSIS — N1832 Chronic kidney disease, stage 3b: Secondary | ICD-10-CM | POA: Diagnosis not present

## 2021-08-12 DIAGNOSIS — E782 Mixed hyperlipidemia: Secondary | ICD-10-CM | POA: Diagnosis not present

## 2021-08-12 DIAGNOSIS — E1122 Type 2 diabetes mellitus with diabetic chronic kidney disease: Secondary | ICD-10-CM | POA: Diagnosis not present

## 2021-10-08 ENCOUNTER — Other Ambulatory Visit: Payer: Self-pay | Admitting: Cardiology

## 2021-10-08 DIAGNOSIS — I10 Essential (primary) hypertension: Secondary | ICD-10-CM

## 2021-10-21 DIAGNOSIS — I130 Hypertensive heart and chronic kidney disease with heart failure and stage 1 through stage 4 chronic kidney disease, or unspecified chronic kidney disease: Secondary | ICD-10-CM | POA: Diagnosis not present

## 2021-10-21 DIAGNOSIS — E782 Mixed hyperlipidemia: Secondary | ICD-10-CM | POA: Diagnosis not present

## 2021-10-21 DIAGNOSIS — E1169 Type 2 diabetes mellitus with other specified complication: Secondary | ICD-10-CM | POA: Diagnosis not present

## 2021-10-21 DIAGNOSIS — R5383 Other fatigue: Secondary | ICD-10-CM | POA: Diagnosis not present

## 2021-10-21 DIAGNOSIS — I129 Hypertensive chronic kidney disease with stage 1 through stage 4 chronic kidney disease, or unspecified chronic kidney disease: Secondary | ICD-10-CM | POA: Diagnosis not present

## 2021-10-21 DIAGNOSIS — N1832 Chronic kidney disease, stage 3b: Secondary | ICD-10-CM | POA: Diagnosis not present

## 2021-10-28 DIAGNOSIS — I129 Hypertensive chronic kidney disease with stage 1 through stage 4 chronic kidney disease, or unspecified chronic kidney disease: Secondary | ICD-10-CM | POA: Diagnosis not present

## 2021-10-28 DIAGNOSIS — I130 Hypertensive heart and chronic kidney disease with heart failure and stage 1 through stage 4 chronic kidney disease, or unspecified chronic kidney disease: Secondary | ICD-10-CM | POA: Diagnosis not present

## 2021-10-28 DIAGNOSIS — E1169 Type 2 diabetes mellitus with other specified complication: Secondary | ICD-10-CM | POA: Diagnosis not present

## 2021-10-28 DIAGNOSIS — Z0000 Encounter for general adult medical examination without abnormal findings: Secondary | ICD-10-CM | POA: Diagnosis not present

## 2021-10-28 DIAGNOSIS — N1832 Chronic kidney disease, stage 3b: Secondary | ICD-10-CM | POA: Diagnosis not present

## 2021-10-28 DIAGNOSIS — I2510 Atherosclerotic heart disease of native coronary artery without angina pectoris: Secondary | ICD-10-CM | POA: Diagnosis not present

## 2021-10-28 DIAGNOSIS — E782 Mixed hyperlipidemia: Secondary | ICD-10-CM | POA: Diagnosis not present

## 2022-01-13 DIAGNOSIS — H43813 Vitreous degeneration, bilateral: Secondary | ICD-10-CM | POA: Diagnosis not present

## 2022-01-13 DIAGNOSIS — H16223 Keratoconjunctivitis sicca, not specified as Sjogren's, bilateral: Secondary | ICD-10-CM | POA: Diagnosis not present

## 2022-01-13 DIAGNOSIS — H353131 Nonexudative age-related macular degeneration, bilateral, early dry stage: Secondary | ICD-10-CM | POA: Diagnosis not present

## 2022-02-17 ENCOUNTER — Ambulatory Visit (INDEPENDENT_AMBULATORY_CARE_PROVIDER_SITE_OTHER): Payer: Medicare Other | Admitting: Pulmonary Disease

## 2022-02-17 ENCOUNTER — Encounter: Payer: Self-pay | Admitting: Pulmonary Disease

## 2022-02-17 VITALS — BP 128/60 | HR 70 | Temp 97.5°F | Ht 70.0 in | Wt 162.0 lb

## 2022-02-17 DIAGNOSIS — G4710 Hypersomnia, unspecified: Secondary | ICD-10-CM

## 2022-02-17 DIAGNOSIS — G4731 Primary central sleep apnea: Secondary | ICD-10-CM

## 2022-02-17 DIAGNOSIS — G4733 Obstructive sleep apnea (adult) (pediatric): Secondary | ICD-10-CM

## 2022-02-17 NOTE — Progress Notes (Signed)
   Subjective:    Patient ID: Calvin Gray, male    DOB: 02-Jan-1926, 88 y.o.   MRN: [id number]  HPI  88 yo with OSA and treatment emergent central apneas -on auto CPAP machine 5 to 12 cm  Annual follow-up visit He has switched to nasal pillows and is settled down with this Denies any problems with pressure.  He is an early riser and is up by 5 AM.  He naps frequently during the day.  Additional history is obtained from his wife.  She has noticed a large leak, this does not bother him or wake him up from sleep. He has issues with DME providing supplies  Significant tests/ events reviewed NPSG 06/2012:  Central apneas present.  Obstructive sleep apnea with AHI 50/hr  Review of Systems neg for any significant sore throat, dysphagia, itching, sneezing, nasal congestion or excess/ purulent secretions, fever, chills, sweats, unintended wt loss, pleuritic or exertional cp, hempoptysis, orthopnea pnd or change in chronic leg swelling. Also denies presyncope, palpitations, heartburn, abdominal pain, nausea, vomiting, diarrhea or change in bowel or urinary habits, dysuria,hematuria, rash, arthralgias, visual complaints, headache, numbness weakness or ataxia.     Objective:   Physical Exam  Gen. Pleasant, elderly,well-nourished, in no distress ENT - no thrush, no pallor/icterus,no post nasal drip Neck: No JVD, no thyromegaly, no carotid bruits Lungs: no use of accessory muscles, no dullness to percussion, clear without rales or rhonchi  Cardiovascular: Rhythm regular, heart sounds  normal, no murmurs or gallops, no peripheral edema Musculoskeletal: No deformities, no cyanosis or clubbing         Assessment & Plan:

## 2022-02-17 NOTE — Assessment & Plan Note (Signed)
He has hypersomnolence in the daytime but I do not think this is due to respiratory loss.   Due to advanced age and comorbidities, would not use modafinil

## 2022-02-17 NOTE — Assessment & Plan Note (Signed)
CPAP download was reviewed which shows residual AHI of 10/hour of these centrals are 6/hour so he has some treatment emergent centrals. Average pressure is about 11 cm , he is on auto settings 5 to 12 cm.  Good compliance 6.5 hours per night with no missed nights and he has a large leak. CPAP is certainly helped his daytime somnolence and fatigue We will renew his CPAP supplies I will not tweak his settings but will accept current residuals -this has fluctuated between 5-10 in the past  Weight loss encouraged, compliance with goal of at least 4-6 hrs every night is the expectation. Advised against medications with sedative side effects Cautioned against driving when sleepy - understanding that sleepiness will vary on a day to day basis

## 2022-02-17 NOTE — Patient Instructions (Signed)
  X CPAP supplies will be renewed  x 1 year  CPAP is effective

## 2022-05-05 DIAGNOSIS — E782 Mixed hyperlipidemia: Secondary | ICD-10-CM | POA: Diagnosis not present

## 2022-05-05 DIAGNOSIS — I2510 Atherosclerotic heart disease of native coronary artery without angina pectoris: Secondary | ICD-10-CM | POA: Diagnosis not present

## 2022-05-05 DIAGNOSIS — E1169 Type 2 diabetes mellitus with other specified complication: Secondary | ICD-10-CM | POA: Diagnosis not present

## 2022-05-05 DIAGNOSIS — N1832 Chronic kidney disease, stage 3b: Secondary | ICD-10-CM | POA: Diagnosis not present

## 2022-05-05 DIAGNOSIS — I130 Hypertensive heart and chronic kidney disease with heart failure and stage 1 through stage 4 chronic kidney disease, or unspecified chronic kidney disease: Secondary | ICD-10-CM | POA: Diagnosis not present

## 2022-05-10 DIAGNOSIS — R3914 Feeling of incomplete bladder emptying: Secondary | ICD-10-CM | POA: Diagnosis not present

## 2022-05-10 DIAGNOSIS — N401 Enlarged prostate with lower urinary tract symptoms: Secondary | ICD-10-CM | POA: Diagnosis not present

## 2022-05-10 DIAGNOSIS — R351 Nocturia: Secondary | ICD-10-CM | POA: Diagnosis not present

## 2022-05-12 DIAGNOSIS — E782 Mixed hyperlipidemia: Secondary | ICD-10-CM | POA: Diagnosis not present

## 2022-05-12 DIAGNOSIS — E1169 Type 2 diabetes mellitus with other specified complication: Secondary | ICD-10-CM | POA: Diagnosis not present

## 2022-05-12 DIAGNOSIS — I2510 Atherosclerotic heart disease of native coronary artery without angina pectoris: Secondary | ICD-10-CM | POA: Diagnosis not present

## 2022-05-12 DIAGNOSIS — N1832 Chronic kidney disease, stage 3b: Secondary | ICD-10-CM | POA: Diagnosis not present

## 2022-05-12 DIAGNOSIS — I130 Hypertensive heart and chronic kidney disease with heart failure and stage 1 through stage 4 chronic kidney disease, or unspecified chronic kidney disease: Secondary | ICD-10-CM | POA: Diagnosis not present

## 2022-05-12 DIAGNOSIS — I129 Hypertensive chronic kidney disease with stage 1 through stage 4 chronic kidney disease, or unspecified chronic kidney disease: Secondary | ICD-10-CM | POA: Diagnosis not present

## 2022-05-21 ENCOUNTER — Ambulatory Visit: Payer: Medicare Other | Admitting: Cardiology

## 2022-05-21 ENCOUNTER — Encounter: Payer: Self-pay | Admitting: Cardiology

## 2022-05-21 VITALS — BP 136/64 | HR 73 | Temp 98.0°F | Resp 16 | Ht 70.0 in | Wt 160.2 lb

## 2022-05-21 DIAGNOSIS — N1832 Chronic kidney disease, stage 3b: Secondary | ICD-10-CM

## 2022-05-21 DIAGNOSIS — I10 Essential (primary) hypertension: Secondary | ICD-10-CM

## 2022-05-21 DIAGNOSIS — E7800 Pure hypercholesterolemia, unspecified: Secondary | ICD-10-CM

## 2022-05-21 DIAGNOSIS — I2510 Atherosclerotic heart disease of native coronary artery without angina pectoris: Secondary | ICD-10-CM

## 2022-05-21 NOTE — Progress Notes (Signed)
Primary Physician/Referring:  Merrilee Seashore, MD  Patient ID: Calvin Gray, male    DOB: March 22, 1926, 88 y.o.   MRN: [id number]  Chief Complaint  Patient presents with   Coronary Artery Disease   Hypertension   Hyperlipidemia   Follow-up    1 year   HPI:    Calvin Gray  is a 88 y.o.  with CAD S/P stenting to Cx in 2013, OSA on CPAP, hyperlipidmia, hypertension, stage IIIb CKD and decreased exercise tolerence presents for annual F/u.  He has no dyspnea or has not had any recurrence of chest pain his main complaint being back pain and right hip pain.  No leg edema.  He has been wearing support stockings regularly.   Past Medical History:  Diagnosis Date   Bradycardia by electrocardiogram 11/01/2018   Bradycardia, sinus 02/08/12   Exertional dyspnea    Gait disturbance 11/01/2018   High cholesterol    HOH (hard of hearing)    Syncope 09/04/2017   Syncope and collapse 09/04/2017   Past Surgical History:  Procedure Laterality Date   CATARACT EXTRACTION W/ INTRAOCULAR LENS  IMPLANT, BILATERAL     CORONARY ANGIOPLASTY WITH STENT PLACEMENT  02/07/12   "1"   LEFT HEART CATHETERIZATION WITH CORONARY ANGIOGRAM N/A 02/08/2012   Procedure: LEFT HEART CATHETERIZATION WITH CORONARY ANGIOGRAM;  Surgeon: Laverda Page, MD;  Location[facility];  Service: Cardiovascular;  Laterality: N/A;   Social History   Tobacco Use   Smoking status: Never   Smokeless tobacco: Never  Substance Use Topics   Alcohol use: No   Marital Status: Married   ROS  Review of Systems  Constitutional: Negative for malaise/fatigue.  Cardiovascular:  Negative for dyspnea on exertion.  Musculoskeletal:  Positive for back pain and joint pain (right hip).   Objective  Blood pressure 136/64, pulse 73, temperature 98 F (36.7 C), temperature source Temporal, resp. rate 16, height 5' 10"  (1.778 m), weight 160 lb 3.2 oz (72.7 kg), SpO2 97 %. Body mass index is 22.99 kg/m.   Physical  Exam Constitutional:      General: He is not in acute distress.    Appearance: He is well-developed.     Comments: Appears younger than stated age  Neck:     Vascular: No carotid bruit or JVD.  Cardiovascular:     Rate and Rhythm: Normal rate and regular rhythm.     Pulses: Intact distal pulses.     Heart sounds: S1 normal and S2 normal. Murmur heard.     Crescendo systolic murmur is present with a grade of 2/6 at the upper right sternal border and apex.     No gallop.  Pulmonary:     Effort: Pulmonary effort is normal.     Breath sounds: Normal breath sounds.  Abdominal:     General: Bowel sounds are normal.     Palpations: Abdomen is soft.  Musculoskeletal:     Right lower leg: No edema.     Left lower leg: No edema.    Radiology: No results found.  Laboratory examination:   External labs  Labs 05/09/2022:  A1c 5.8%.  Total cholesterol 125, triglycerides 59, HDL 38, LDL 75.  Sodium 143, potassium 4.6, BUN 33, creatinine 1.90, EGFR 29 mL, LFTs normal.  Labs 09/17/2020:  Hb 12.0/HCT 36.1, platelets 161, normal indicis.  Serum glucose 106 mg, BUN 22, creatinine 1.47, EGFR 41 mL, potassium 4.4, CMP otherwise normal.  Medications  No Known Allergies  Current Outpatient Medications:    alfuzosin (UROXATRAL) 10 MG 24 hr tablet, Take 1 tablet (10 mg total) by mouth daily with breakfast. (Patient taking differently: Take 10 mg by mouth daily.), Disp: 300 tablet, Rfl: 0   amLODipine (NORVASC) 10 MG tablet, TAKE 1 TABLET(10 MG) BY MOUTH DAILY, Disp: 90 tablet, Rfl: 3   aspirin EC 81 MG tablet, Take 81 mg by mouth daily., Disp: , Rfl:    finasteride (PROSCAR) 5 MG tablet, Take 5 mg by mouth daily., Disp: , Rfl:    irbesartan (AVAPRO) 75 MG tablet, Take 75 mg by mouth daily., Disp: , Rfl:    Multiple Vitamin (MULTIVITAMIN) tablet, Take 1 tablet by mouth daily., Disp: , Rfl:    pravastatin (PRAVACHOL) 80 MG tablet, Take 80 mg by mouth daily., Disp: , Rfl:     tetrahydrozoline 0.05 % ophthalmic solution, Place 1 drop into both eyes daily., Disp: , Rfl:    Cardiac Studies:   CPX Aug 17, 2018:   CPX Aug 17, 2018: Conclusion: Exercise testing with gas exchange demonstrates mild functional impairment when compared to matched sedentary norms. There was a mild hypertensive response to exercise, however limitations are more likely due to age. Pre-exercise spirometry within normal limits. Overall mild functional limitation based on age-based norms. Suspect due to mild diastolic HF. No evidence of ischemia or pulmonary limitation noted. During exercise had occasional pacing spikes in QRS.  Coronary Angiogram   02/08/2012: Stent Mid Circumflex 3.5x22 Resolute. Residual RI sec branch 80% large.  EKG: EKG 05/21/2022: Normal sinus rhythm with rate of 65 bpm, normal axis, incomplete right bundle branch block.  IVCD, borderline criteria for LVH.  No change from 05/31/2021.   Assessment     ICD-10-CM   1. Coronary artery disease involving native coronary artery of native heart without angina pectoris  I25.10 EKG 12-Lead    2. Primary hypertension  I10     3. Hypercholesteremia  E78.00     4. Stage 3b chronic kidney disease ([place])  N18.32       Recommendations:    Calvin Gray  is a 88 y.o. with CAD S/P stenting to Cx in 2013, OSA on CPAP, hyperlipidmia, hypertension, stage IIIb CKD and decreased exercise tolerence presents for annual F/u.  He is presently asymptomatic without recurrence of angina pectoris, he has not had any hospitalization for heart failure as well.  He continues to remain active, he is accompanied by his wife.  Physical examination except for aortic sclerotic murmur is unchanged.  Vascular examination is normal.  I reviewed his next labs, lipids and excellent control.  Renal function has mostly remained stable at stage IIIb chronic kidney disease with very mild worsening of serum creatinine but stable again.  No bleeding diathesis on  aspirin.  No changes were done today, I will see him back in a year or sooner if problems.   Calvin Prows, MD, [facility] 05/21/2022, 11:50 AM Office: [phone]

## 2022-06-06 DIAGNOSIS — T148XXA Other injury of unspecified body region, initial encounter: Secondary | ICD-10-CM | POA: Diagnosis not present

## 2022-06-08 DIAGNOSIS — S51011D Laceration without foreign body of right elbow, subsequent encounter: Secondary | ICD-10-CM | POA: Diagnosis not present

## 2022-06-29 DIAGNOSIS — B029 Zoster without complications: Secondary | ICD-10-CM | POA: Diagnosis not present

## 2022-07-12 DIAGNOSIS — Z23 Encounter for immunization: Secondary | ICD-10-CM | POA: Diagnosis not present

## 2022-10-03 ENCOUNTER — Other Ambulatory Visit: Payer: Self-pay | Admitting: Cardiology

## 2022-10-03 DIAGNOSIS — I10 Essential (primary) hypertension: Secondary | ICD-10-CM

## 2022-10-05 DIAGNOSIS — B0229 Other postherpetic nervous system involvement: Secondary | ICD-10-CM | POA: Diagnosis not present

## 2022-11-09 DIAGNOSIS — I130 Hypertensive heart and chronic kidney disease with heart failure and stage 1 through stage 4 chronic kidney disease, or unspecified chronic kidney disease: Secondary | ICD-10-CM | POA: Diagnosis not present

## 2022-11-09 DIAGNOSIS — E782 Mixed hyperlipidemia: Secondary | ICD-10-CM | POA: Diagnosis not present

## 2022-11-09 DIAGNOSIS — N1832 Chronic kidney disease, stage 3b: Secondary | ICD-10-CM | POA: Diagnosis not present

## 2022-11-09 DIAGNOSIS — R5383 Other fatigue: Secondary | ICD-10-CM | POA: Diagnosis not present

## 2022-11-09 DIAGNOSIS — E1169 Type 2 diabetes mellitus with other specified complication: Secondary | ICD-10-CM | POA: Diagnosis not present

## 2022-11-16 DIAGNOSIS — N1832 Chronic kidney disease, stage 3b: Secondary | ICD-10-CM | POA: Diagnosis not present

## 2022-11-16 DIAGNOSIS — Z0000 Encounter for general adult medical examination without abnormal findings: Secondary | ICD-10-CM | POA: Diagnosis not present

## 2022-11-16 DIAGNOSIS — I1310 Hypertensive heart and chronic kidney disease without heart failure, with stage 1 through stage 4 chronic kidney disease, or unspecified chronic kidney disease: Secondary | ICD-10-CM | POA: Diagnosis not present

## 2022-11-16 DIAGNOSIS — E782 Mixed hyperlipidemia: Secondary | ICD-10-CM | POA: Diagnosis not present

## 2022-11-16 DIAGNOSIS — E1169 Type 2 diabetes mellitus with other specified complication: Secondary | ICD-10-CM | POA: Diagnosis not present

## 2022-11-16 DIAGNOSIS — Z23 Encounter for immunization: Secondary | ICD-10-CM | POA: Diagnosis not present

## 2023-02-14 ENCOUNTER — Ambulatory Visit: Payer: Medicare Other | Admitting: Adult Health

## 2023-02-14 ENCOUNTER — Encounter: Payer: Self-pay | Admitting: Adult Health

## 2023-02-14 VITALS — BP 128/50 | HR 77 | Temp 97.5°F | Ht 70.0 in | Wt 161.6 lb

## 2023-02-14 DIAGNOSIS — G4731 Primary central sleep apnea: Secondary | ICD-10-CM | POA: Diagnosis not present

## 2023-02-14 NOTE — Progress Notes (Signed)
@Patient  ID: Calvin Gray, male    DOB: December 22, 1925, 89 y.o.   MRN: [id number]  Chief Complaint  Patient presents with   Follow-up    Referring provider: Georgianne Fick, MD  HPI: 89 year old male followed for obstructive sleep apnea with treatment emergent central apneas  TEST/EVENTS :  NPSG 06/2012:  Central apneas present.  Obstructive sleep apnea with AHI 50/hr On autocpap 5-12 cm water.   02/14/2023 Follow up: OSA Patient returns for a 1 year follow-up.  Patient has underlying complex sleep apnea.  Previous sleep study showed severe sleep apnea.  He has some residual treatment emergent central apneas.  Patient says he wears a CPAP every single night.  Never sleeps without it.  Feels that he benefits from CPAP.  CPAP download shows excellent compliance with 100% usage.  Daily average usage is 6.5 hours.  Patient is on auto CPAP 5 to 12 cm H2O.  AHI 10.9/hour, central events 5.0/hour.  Daily average pressure 11.2 cm H2O.  Uses nasal pillows.    No Known Allergies  Immunization History  Administered Date(s) Administered   COVID-19, mRNA, vaccine(Comirnaty)12 years and older 11/16/2022   Influenza Split 09/13/2010, 07/02/2012   Influenza, High Dose Seasonal PF 08/05/2015, 07/07/2016, 07/08/2018   Influenza, Quadrivalent, Recombinant, Inj, Pf 08/17/2017, 06/28/2018, 06/25/2019, 07/06/2021   Influenza,inj,Quad PF,6+ Mos 06/13/2013, 06/14/2015   Influenza-Unspecified 06/13/2014, 08/13/2018   PFIZER(Purple Top)SARS-COV-2 Vaccination 10/08/2019, 10/29/2019, 07/02/2020   Pneumococcal Conjugate-13 07/23/2014    Past Medical History:  Diagnosis Date   Bradycardia by electrocardiogram 11/01/2018   Bradycardia, sinus 02/08/12   Exertional dyspnea    Gait disturbance 11/01/2018   High cholesterol    HOH (hard of hearing)    Syncope 09/04/2017   Syncope and collapse 09/04/2017    Tobacco History: Social History   Tobacco Use  Smoking Status Never  Smokeless Tobacco Never    Counseling given: Not Answered   Outpatient Medications Prior to Visit  Medication Sig Dispense Refill   amLODipine (NORVASC) 10 MG tablet TAKE 1 TABLET(10 MG) BY MOUTH DAILY 90 tablet 3   aspirin EC 81 MG tablet Take 81 mg by mouth daily.     finasteride (PROSCAR) 5 MG tablet Take 5 mg by mouth daily.     irbesartan (AVAPRO) 75 MG tablet Take 75 mg by mouth daily.     Multiple Vitamin (MULTIVITAMIN) tablet Take 1 tablet by mouth daily.     pravastatin (PRAVACHOL) 80 MG tablet Take 80 mg by mouth daily.     silodosin (RAPAFLO) 8 MG CAPS capsule Take 8 mg by mouth daily with breakfast.     tetrahydrozoline 0.05 % ophthalmic solution Place 1 drop into both eyes daily.     alfuzosin (UROXATRAL) 10 MG 24 hr tablet Take 1 tablet (10 mg total) by mouth daily with breakfast. (Patient not taking: Reported on 02/14/2023) 300 tablet 0   No facility-administered medications prior to visit.     Review of Systems:   Constitutional:   No  weight loss, night sweats,  Fevers, chills, + fatigue, or  lassitude.  HEENT:   No headaches,  Difficulty swallowing,  Tooth/dental problems, or  Sore throat,                No sneezing, itching, ear ache, nasal congestion, post nasal drip,   CV:  No chest pain,  Orthopnea, PND, swelling in lower extremities, anasarca, dizziness, palpitations, syncope.   GI  No heartburn, indigestion, abdominal pain, nausea, vomiting, diarrhea, change in  bowel habits, loss of appetite, bloody stools.   Resp: No shortness of breath with exertion or at rest.  No excess mucus, no productive cough,  No non-productive cough,  No coughing up of blood.  No change in color of mucus.  No wheezing.  No chest wall deformity  Skin: no rash or lesions.  GU: no dysuria, change in color of urine, no urgency or frequency.  No flank pain, no hematuria   MS:  No joint pain or swelling.  No decreased range of motion.  No back pain.    Physical Exam  BP (!) 128/50 (BP Location: Left Arm,  Patient Position: Sitting, Cuff Size: Normal)   Pulse 77   Temp (!) 97.5 F (36.4 C) (Oral)   Ht 5\' 10"  (1.778 m)   Wt 161 lb 9.6 oz (73.3 kg)   SpO2 96%   BMI 23.19 kg/m   GEN: A/Ox3; pleasant , NAD, elderly   HEENT:  [place]/AT,  NOSE-clear, THROAT-clear, no lesions, no postnasal drip or exudate noted.   NECK:  Supple w/ fair ROM; no JVD; normal carotid impulses w/o bruits; no thyromegaly or nodules palpated; no lymphadenopathy.    RESP  Clear  P & A; w/o, wheezes/ rales/ or rhonchi. no accessory muscle use, no dullness to percussion  CARD:  RRR, no m/r/g, no peripheral edema, pulses intact, no cyanosis or clubbing.  GI:   Soft & nt; nml bowel sounds; no organomegaly or masses detected.   Musco: Warm bil, no deformities or joint swelling noted.   Neuro: alert, no focal deficits noted.    Skin: Warm, no lesions or rashes    Lab Results:    BNP No results found for: "BNP"  ProBNP No results found for: "PROBNP"  Imaging: No results found.        No data to display          No results found for: "NITRICOXIDE"      Assessment & Plan:   Complex sleep apnea syndrome Excellent compliance on CPAP.  He does have a few residual events that seems to be pretty consistent each year on his follow-ups.  No changes at this time.  Seems to be tolerating well.  Plan Patient Instructions  Continue on CPAP At bedtime, wear CPAP all night long for at least 6hr or more.  Do not drive if sleepy  Follow up with Dr. Vassie Loll in 1 year and As needed        Rubye Oaks, NP 02/14/2023

## 2023-02-14 NOTE — Assessment & Plan Note (Signed)
Excellent compliance on CPAP.  He does have a few residual events that seems to be pretty consistent each year on his follow-ups.  No changes at this time.  Seems to be tolerating well.  Plan Patient Instructions  Continue on CPAP At bedtime, wear CPAP all night long for at least 6hr or more.  Do not drive if sleepy  Follow up with Dr. Vassie Loll in 1 year and As needed

## 2023-02-14 NOTE — Patient Instructions (Addendum)
Continue on CPAP At bedtime, wear CPAP all night long for at least 6hr or more.  Do not drive if sleepy  Follow up with Dr. Vassie Loll in 1 year and As needed

## 2023-02-15 NOTE — Addendum Note (Signed)
Addended by: Delrae Rend on: 02/15/2023 01:49 PM   Modules accepted: Orders

## 2023-03-29 DIAGNOSIS — K080 Exfoliation of teeth due to systemic causes: Secondary | ICD-10-CM | POA: Diagnosis not present

## 2023-04-20 DIAGNOSIS — H52223 Regular astigmatism, bilateral: Secondary | ICD-10-CM | POA: Diagnosis not present

## 2023-04-20 DIAGNOSIS — H353131 Nonexudative age-related macular degeneration, bilateral, early dry stage: Secondary | ICD-10-CM | POA: Diagnosis not present

## 2023-04-20 DIAGNOSIS — H43813 Vitreous degeneration, bilateral: Secondary | ICD-10-CM | POA: Diagnosis not present

## 2023-04-20 DIAGNOSIS — H16223 Keratoconjunctivitis sicca, not specified as Sjogren's, bilateral: Secondary | ICD-10-CM | POA: Diagnosis not present

## 2023-05-10 DIAGNOSIS — N401 Enlarged prostate with lower urinary tract symptoms: Secondary | ICD-10-CM | POA: Diagnosis not present

## 2023-05-10 DIAGNOSIS — R351 Nocturia: Secondary | ICD-10-CM | POA: Diagnosis not present

## 2023-05-10 DIAGNOSIS — R3914 Feeling of incomplete bladder emptying: Secondary | ICD-10-CM | POA: Diagnosis not present

## 2023-05-23 ENCOUNTER — Ambulatory Visit: Payer: Medicare Other | Admitting: Cardiology

## 2023-05-23 DIAGNOSIS — G4733 Obstructive sleep apnea (adult) (pediatric): Secondary | ICD-10-CM | POA: Diagnosis not present

## 2023-05-31 DIAGNOSIS — E782 Mixed hyperlipidemia: Secondary | ICD-10-CM | POA: Diagnosis not present

## 2023-05-31 DIAGNOSIS — I1310 Hypertensive heart and chronic kidney disease without heart failure, with stage 1 through stage 4 chronic kidney disease, or unspecified chronic kidney disease: Secondary | ICD-10-CM | POA: Diagnosis not present

## 2023-05-31 DIAGNOSIS — E1169 Type 2 diabetes mellitus with other specified complication: Secondary | ICD-10-CM | POA: Diagnosis not present

## 2023-05-31 DIAGNOSIS — N1832 Chronic kidney disease, stage 3b: Secondary | ICD-10-CM | POA: Diagnosis not present

## 2023-05-31 DIAGNOSIS — I2510 Atherosclerotic heart disease of native coronary artery without angina pectoris: Secondary | ICD-10-CM | POA: Diagnosis not present

## 2023-06-07 DIAGNOSIS — N1832 Chronic kidney disease, stage 3b: Secondary | ICD-10-CM | POA: Diagnosis not present

## 2023-06-07 DIAGNOSIS — Z23 Encounter for immunization: Secondary | ICD-10-CM | POA: Diagnosis not present

## 2023-06-07 DIAGNOSIS — E782 Mixed hyperlipidemia: Secondary | ICD-10-CM | POA: Diagnosis not present

## 2023-06-07 DIAGNOSIS — E1169 Type 2 diabetes mellitus with other specified complication: Secondary | ICD-10-CM | POA: Diagnosis not present

## 2023-06-07 DIAGNOSIS — I1310 Hypertensive heart and chronic kidney disease without heart failure, with stage 1 through stage 4 chronic kidney disease, or unspecified chronic kidney disease: Secondary | ICD-10-CM | POA: Diagnosis not present

## 2023-06-22 DIAGNOSIS — G4733 Obstructive sleep apnea (adult) (pediatric): Secondary | ICD-10-CM | POA: Diagnosis not present

## 2023-06-27 ENCOUNTER — Ambulatory Visit: Payer: Self-pay | Admitting: Cardiology

## 2023-07-04 ENCOUNTER — Other Ambulatory Visit: Payer: Self-pay | Admitting: Cardiology

## 2023-07-04 DIAGNOSIS — I10 Essential (primary) hypertension: Secondary | ICD-10-CM

## 2023-07-23 DIAGNOSIS — G4733 Obstructive sleep apnea (adult) (pediatric): Secondary | ICD-10-CM | POA: Diagnosis not present

## 2023-07-25 ENCOUNTER — Ambulatory Visit: Payer: Medicare Other | Admitting: Cardiology

## 2023-07-26 ENCOUNTER — Ambulatory Visit: Payer: Medicare Other | Attending: Cardiology | Admitting: Cardiology

## 2023-07-26 VITALS — BP 120/60 | HR 65 | Resp 16 | Ht 70.0 in | Wt 161.0 lb

## 2023-07-26 DIAGNOSIS — N1832 Chronic kidney disease, stage 3b: Secondary | ICD-10-CM

## 2023-07-26 DIAGNOSIS — G4739 Other sleep apnea: Secondary | ICD-10-CM

## 2023-07-26 DIAGNOSIS — R5383 Other fatigue: Secondary | ICD-10-CM

## 2023-07-26 DIAGNOSIS — I10 Essential (primary) hypertension: Secondary | ICD-10-CM

## 2023-07-26 DIAGNOSIS — I2510 Atherosclerotic heart disease of native coronary artery without angina pectoris: Secondary | ICD-10-CM | POA: Diagnosis not present

## 2023-07-26 MED ORDER — AMLODIPINE BESYLATE 10 MG PO TABS: 10.0000 mg | ORAL_TABLET | Freq: Every day | ORAL | 3 refills | Status: DC

## 2023-07-26 NOTE — Progress Notes (Signed)
Cardiology Office Note:   Date:  07/26/2023  ID:  Jerrid, Cabbage [birth year], MRN [id number] PCP: Georgianne Fick, MD  [facility] Providers Cardiologist:  Yates Decamp, MD    History of Present Illness:   Discussed the use of AI scribe software for clinical note transcription with the patient, who gave verbal consent to proceed.  History of Present Illness   The patient, a 89 year old with a history of coronary artery disease, obstructive sleep apnea, hyperlipidemia, hypertension, and stage 3B chronic kidney disease, presents for routine follow up. He reports a long-standing complaint of decreased exertional tolerance. He describes fatigue and weakness, particularly when engaging in physical activities such as yard work or splitting firewood. This fatigue has been ongoing for several years, with no major changes noted recently. The patient denies experiencing chest pain or shortness of breath, and there are no reports of palpitations or evidence of abnormal heart rhythms.  The patient also reports some discomfort in the right hip, which is exacerbated when standing still. This discomfort does not appear to be associated with the primary complaint of fatigue. The patient has a history of falls, with two incidents reported in the past few years. The patient does not report feeling dizzy or lightheaded, suggesting that these falls may be related to balance issues rather than cardiovascular symptoms.  The patient has been managing his various health conditions with a regimen of medications, including a cholesterol medicine and two different blood pressure medicines. He also wears compression socks to manage swelling in the legs. Despite his health conditions and age, the patient remains active, albeit at a slower pace than in the past. He has not reported any new symptoms or significant changes in his health status since his last medical consultation.      Studies Reviewed:    EKG:    EKG Interpretation Date/Time:  Tuesday July 26 2023 14:37:29 EST Ventricular Rate:  65 PR Interval:  194 QRS Duration:  130 QT Interval:  394 QTC Calculation: 409 R Axis:   -35  Text Interpretation: Normal sinus rhythm Left axis deviation Non-specific intra-ventricular conduction block Minimal voltage criteria for LVH, may be normal variant ( Cornell product ) When compared with ECG of 04-Sep-2017 13:17, QRS axis Shifted left Criteria for Septal infarct are no longer Present Confirmed by Perlie Gold [phone]) on 07/26/2023 3:07:47 PM   Risk Assessment/Calculations:              Physical Exam:   VS:  BP 120/60   Pulse 65   Resp 16   Ht 5\' 10"  (1.778 m)   Wt 161 lb (73 kg)   SpO2 95%   BMI 23.10 kg/m    Wt Readings from Last 3 Encounters:  07/26/23 161 lb (73 kg)  02/14/23 161 lb 9.6 oz (73.3 kg)  05/21/22 160 lb 3.2 oz (72.7 kg)     Physical Exam Vitals reviewed.  Constitutional:      Appearance: Normal appearance.  HENT:     Head: Normocephalic.  Eyes:     Pupils: Pupils are equal, round, and reactive to light.  Cardiovascular:     Rate and Rhythm: Normal rate and regular rhythm.     Pulses: Normal pulses.     Heart sounds: Normal heart sounds.  Pulmonary:     Effort: Pulmonary effort is normal.     Breath sounds: Normal breath sounds.  Abdominal:     General: Abdomen is flat.     Palpations: Abdomen is  soft.  Musculoskeletal:     Right lower leg: No edema.     Left lower leg: No edema.  Skin:    General: Skin is warm and dry.     Capillary Refill: Capillary refill takes less than 2 seconds.  Neurological:     General: No focal deficit present.     Mental Status: He is alert and oriented to person, place, and time.  Psychiatric:        Mood and Affect: Mood normal.        Behavior: Behavior normal.        Thought Content: Thought content normal.        Judgment: Judgment normal.      ASSESSMENT AND PLAN:     Assessment and Plan     Decreased Exertional Tolerance Chronic decreased exertional tolerance, likely multifactorial due to age, coronary artery disease, and chronic kidney disease. No chest pain, shortness of breath, or palpitations. Having to take breaks with activities such as chopping firewood and yard work. - Encourage activity within tolerance - Take breaks as needed - Continue current medications  Coronary Artery Disease Coronary artery disease with prior stenting to the left circumflex in 2013. No recurrent symptoms. EKG stable. Active but experiences fatigue. - Continue aspirin 81 mg daily - Continue pravastatin 80 mg daily  Chronic Kidney Disease Stage 3B Well-managed chronic kidney disease. Followed by nephrologist. Most recent labs appear stable. - Continue follow-up with nephrologist  Hypertension Well-controlled hypertension. Blood pressure today is 120/60 mmHg. On amlodipine 10 mg and irbesartan 75 mg. Discussed importance of home blood pressure monitoring to avoid hypotension and potential falls. - Continue amlodipine 10 mg daily - Continue irbesartan 75 mg daily - Check blood pressure at home a couple of times a week - If blood pressure consistently <120 mmHg systolic, consider reducing medication  Hyperlipidemia Well-controlled hyperlipidemia with pravastatin. LDL is 73 mg/dL as of September 1610. - Continue pravastatin 80 mg daily  Obstructive Sleep Apnea Obstructive sleep apnea managed with CPAP. No new issues reported. - Continue CPAP use  General Health Maintenance Discussed importance of staying active within limits and taking breaks. No new major health issues. Blood pressure and labs stable. No need for additional labs today as recent labs in September 2024 were stable. - Schedule follow-up visit in one year  Follow-up - Order refill for amlodipine - Schedule follow-up visit in one year.            Signed, Perlie Gold, PA-C

## 2023-07-26 NOTE — Patient Instructions (Signed)
Medication Instructions:  Your physician recommends that you continue on your current medications as directed. Please refer to the Current Medication list given to you today.  *If you need a refill on your cardiac medications before your next appointment, please call your pharmacy*   Lab Work: None ordered  If you have labs (blood work) drawn today and your tests are completely normal, you will receive your results only by: MyChart Message (if you have MyChart) OR A paper copy in the mail If you have any lab test that is abnormal or we need to change your treatment, we will call you to review the results.   Testing/Procedures: None ordered   Follow-Up: At [facility], you and your health needs are our priority.  As part of our continuing mission to provide you with exceptional heart care, we have created designated Provider Care Teams.  These Care Teams include your primary Cardiologist (physician) and Advanced Practice Providers (APPs -  Physician Assistants and Nurse Practitioners) who all work together to provide you with the care you need, when you need it.  We recommend signing up for the patient portal called "MyChart".  Sign up information is provided on this After Visit Summary.  MyChart is used to connect with patients for Virtual Visits (Telemedicine).  Patients are able to view lab/test results, encounter notes, upcoming appointments, etc.  Non-urgent messages can be sent to your provider as well.   To learn more about what you can do with MyChart, go to [email or web address].    Your next appointment:   12 month(s)  Provider:   Yates Decamp, MD     Other Instructions

## 2023-11-28 DIAGNOSIS — N1832 Chronic kidney disease, stage 3b: Secondary | ICD-10-CM | POA: Diagnosis not present

## 2023-11-28 DIAGNOSIS — I2510 Atherosclerotic heart disease of native coronary artery without angina pectoris: Secondary | ICD-10-CM | POA: Diagnosis not present

## 2023-11-28 DIAGNOSIS — E1169 Type 2 diabetes mellitus with other specified complication: Secondary | ICD-10-CM | POA: Diagnosis not present

## 2023-11-28 DIAGNOSIS — I129 Hypertensive chronic kidney disease with stage 1 through stage 4 chronic kidney disease, or unspecified chronic kidney disease: Secondary | ICD-10-CM | POA: Diagnosis not present

## 2023-11-28 DIAGNOSIS — I1310 Hypertensive heart and chronic kidney disease without heart failure, with stage 1 through stage 4 chronic kidney disease, or unspecified chronic kidney disease: Secondary | ICD-10-CM | POA: Diagnosis not present

## 2023-11-28 DIAGNOSIS — E782 Mixed hyperlipidemia: Secondary | ICD-10-CM | POA: Diagnosis not present

## 2023-12-05 DIAGNOSIS — Z0000 Encounter for general adult medical examination without abnormal findings: Secondary | ICD-10-CM | POA: Diagnosis not present

## 2024-01-10 DIAGNOSIS — K080 Exfoliation of teeth due to systemic causes: Secondary | ICD-10-CM | POA: Diagnosis not present

## 2024-03-27 DIAGNOSIS — K080 Exfoliation of teeth due to systemic causes: Secondary | ICD-10-CM | POA: Diagnosis not present

## 2024-04-23 DIAGNOSIS — Z961 Presence of intraocular lens: Secondary | ICD-10-CM | POA: Diagnosis not present

## 2024-04-23 DIAGNOSIS — H353131 Nonexudative age-related macular degeneration, bilateral, early dry stage: Secondary | ICD-10-CM | POA: Diagnosis not present

## 2024-04-23 DIAGNOSIS — H43813 Vitreous degeneration, bilateral: Secondary | ICD-10-CM | POA: Diagnosis not present

## 2024-04-23 DIAGNOSIS — H16223 Keratoconjunctivitis sicca, not specified as Sjogren's, bilateral: Secondary | ICD-10-CM | POA: Diagnosis not present

## 2024-05-03 ENCOUNTER — Ambulatory Visit (INDEPENDENT_AMBULATORY_CARE_PROVIDER_SITE_OTHER): Admitting: Pulmonary Disease

## 2024-05-03 ENCOUNTER — Encounter (HOSPITAL_BASED_OUTPATIENT_CLINIC_OR_DEPARTMENT_OTHER): Payer: Self-pay | Admitting: Pulmonary Disease

## 2024-05-03 VITALS — BP 124/63 | HR 80 | Ht 70.0 in | Wt 158.2 lb

## 2024-05-03 DIAGNOSIS — G4731 Primary central sleep apnea: Secondary | ICD-10-CM

## 2024-05-03 DIAGNOSIS — G4733 Obstructive sleep apnea (adult) (pediatric): Secondary | ICD-10-CM | POA: Diagnosis not present

## 2024-05-03 NOTE — Patient Instructions (Signed)
  VISIT SUMMARY: You came in today for your one-year follow-up regarding your sleep apnea. You mentioned experiencing significant fatigue, especially during physical activities, and noted that you use your CPAP machine nightly but still have some issues with mask leakage and central sleep apnea events.  YOUR PLAN: -OBSTRUCTIVE AND CENTRAL SLEEP APNEA: Obstructive sleep apnea is a condition where your airway becomes blocked during sleep, causing breathing pauses. Central sleep apnea is when your brain doesn't send proper signals to the muscles that control breathing. Your obstructive sleep apnea is well-managed with your CPAP machine, but you still have some central sleep apnea events and occasional mask leakage. We will adjust your CPAP pressure settings to 8-11 cm H2O and instruct the CPAP company to make these adjustments remotely.  INSTRUCTIONS: Please follow up with the CPAP company to ensure the pressure settings are adjusted as discussed. Continue using your CPAP machine nightly and monitor for any changes in your symptoms. If you continue to experience significant fatigue or other issues, please schedule another appointment.                      Contains text generated by Abridge.                                 Contains text generated by Abridge.

## 2024-05-03 NOTE — Progress Notes (Signed)
   Subjective:    Patient ID: [place] JAYSON Lerner, male    DOB: 22-Feb-1926, 89 y.o.   MRN: [id number]   89 yo with OSA and treatment emergent central apneas -on auto CPAP machine 5 to 12 cm   Significant tests/ events reviewed NPSG 06/2012:  Central apneas present.  Obstructive sleep apnea with AHI 50/hr    Discussed the use of AI scribe software for clinical note transcription with the patient, who gave verbal consent to proceed.  History of Present Illness   Discussed the use of AI scribe software for clinical note transcription with the patient, who gave verbal consent to proceed.  History of Present Illness   Alasdair C Cypert is a 89 year old male with sleep apnea who presents for a one-year follow-up.  He experiences significant fatigue, particularly during physical activities, requiring frequent rest. He denies shortness of breath but experiences rapid onset of tiredness.  He is on blood pressure medications, aspirin , cholesterol medication, and medications from a urologist, but is uncertain about the necessity of all his medications. Persistent tiredness has been discussed with his cardiologist without resolution.      Download >> He uses a CPAP machine nightly and is compliant with its use, yet experiences approximately ten events per hour, with six being central events. Occasional mask leakage is noted, with air blowing towards him.  Review of Systems  neg for any significant sore throat, dysphagia, itching, sneezing, nasal congestion or excess/ purulent secretions, fever, chills, sweats, unintended wt loss, pleuritic or exertional cp, hempoptysis, orthopnea pnd or change in chronic leg swelling. Also denies presyncope, palpitations, heartburn, abdominal pain, nausea, vomiting, diarrhea or change in bowel or urinary habits, dysuria,hematuria, rash, arthralgias, visual complaints, headache, numbness weakness or ataxia.      Objective:   Physical Exam  Gen. Pleasant,  well-nourished,elderly, in no distress ENT - no thrush, no pallor/icterus,no post nasal drip Neck: No JVD, no thyromegaly, no carotid bruits Lungs: no use of accessory muscles, no dullness to percussion, clear without rales or rhonchi  Cardiovascular: Rhythm regular, heart sounds  normal, no murmurs or gallops, no peripheral edema Musculoskeletal: No deformities, no cyanosis or clubbing        Assessment & Plan:   Assessment and Plan Assessment & Plan   Assessment and Plan    Obstructive and central sleep apnea on CPAP therapy with residual central events and mask leak Obstructive sleep apnea is well-managed with CPAP therapy, preventing obstructive events. Residual central sleep apnea events occur at a rate of approximately 10 per hour, with 6 being central events, likely due to CPAP use. The current central event rate does not necessitate a change to a different machine, which would require a sleep lab evaluation. Mask leak is present, possibly due to improper seal or positioning, but it is not significantly disruptive. - Adjust CPAP pressure settings to 8-11 cm H2O. - Instruct CPAP company to make remote adjustments to pressure settings.

## 2024-05-09 DIAGNOSIS — G4733 Obstructive sleep apnea (adult) (pediatric): Secondary | ICD-10-CM | POA: Diagnosis not present

## 2024-05-10 DIAGNOSIS — R3914 Feeling of incomplete bladder emptying: Secondary | ICD-10-CM | POA: Diagnosis not present

## 2024-05-10 DIAGNOSIS — N401 Enlarged prostate with lower urinary tract symptoms: Secondary | ICD-10-CM | POA: Diagnosis not present

## 2024-05-10 DIAGNOSIS — R351 Nocturia: Secondary | ICD-10-CM | POA: Diagnosis not present

## 2024-06-25 DIAGNOSIS — I2510 Atherosclerotic heart disease of native coronary artery without angina pectoris: Secondary | ICD-10-CM | POA: Diagnosis not present

## 2024-06-25 DIAGNOSIS — N1832 Chronic kidney disease, stage 3b: Secondary | ICD-10-CM | POA: Diagnosis not present

## 2024-06-25 DIAGNOSIS — E1169 Type 2 diabetes mellitus with other specified complication: Secondary | ICD-10-CM | POA: Diagnosis not present

## 2024-06-25 DIAGNOSIS — I1310 Hypertensive heart and chronic kidney disease without heart failure, with stage 1 through stage 4 chronic kidney disease, or unspecified chronic kidney disease: Secondary | ICD-10-CM | POA: Diagnosis not present

## 2024-06-29 ENCOUNTER — Other Ambulatory Visit: Payer: Self-pay | Admitting: Internal Medicine

## 2024-06-29 DIAGNOSIS — I10 Essential (primary) hypertension: Secondary | ICD-10-CM

## 2024-07-02 ENCOUNTER — Other Ambulatory Visit: Payer: Self-pay | Admitting: Cardiology

## 2024-07-02 DIAGNOSIS — Z23 Encounter for immunization: Secondary | ICD-10-CM | POA: Diagnosis not present

## 2024-07-02 DIAGNOSIS — E1169 Type 2 diabetes mellitus with other specified complication: Secondary | ICD-10-CM | POA: Diagnosis not present

## 2024-07-02 DIAGNOSIS — I10 Essential (primary) hypertension: Secondary | ICD-10-CM

## 2024-07-02 DIAGNOSIS — I1310 Hypertensive heart and chronic kidney disease without heart failure, with stage 1 through stage 4 chronic kidney disease, or unspecified chronic kidney disease: Secondary | ICD-10-CM | POA: Diagnosis not present

## 2024-07-02 DIAGNOSIS — N1832 Chronic kidney disease, stage 3b: Secondary | ICD-10-CM | POA: Diagnosis not present

## 2024-07-02 DIAGNOSIS — E782 Mixed hyperlipidemia: Secondary | ICD-10-CM | POA: Diagnosis not present

## 2024-07-05 MED ORDER — AMLODIPINE BESYLATE 10 MG PO TABS: 10.0000 mg | ORAL_TABLET | Freq: Every day | ORAL | 0 refills | Status: DC

## 2024-08-08 ENCOUNTER — Ambulatory Visit: Attending: Cardiology | Admitting: Cardiology

## 2024-08-08 ENCOUNTER — Encounter: Payer: Self-pay | Admitting: Cardiology

## 2024-08-08 VITALS — BP 110/62 | HR 68 | Ht 70.0 in | Wt 161.2 lb

## 2024-08-08 DIAGNOSIS — G4739 Other sleep apnea: Secondary | ICD-10-CM

## 2024-08-08 DIAGNOSIS — I2510 Atherosclerotic heart disease of native coronary artery without angina pectoris: Secondary | ICD-10-CM | POA: Diagnosis not present

## 2024-08-08 DIAGNOSIS — N1832 Chronic kidney disease, stage 3b: Secondary | ICD-10-CM

## 2024-08-08 DIAGNOSIS — I10 Essential (primary) hypertension: Secondary | ICD-10-CM

## 2024-08-08 NOTE — Progress Notes (Signed)
 Cardiology Office Note:  .   Date:  08/08/2024  ID:  ISEAH PLOUFF, DOB 20-Dec-1925, MRN [id number] PCP: Verdia Lombard, MD  [facility] Providers Cardiologist:  Gordy Bergamo, MD   History of Present Illness: .   Calvin Gray is a 89 y.o.  coronary artery disease S/P stenting to Cx in 2013, , obstructive sleep apnea, hyperlipidemia, hypertension, and stage 3B chronic kidney disease, presents for routine follow up.  Remains asymptomatic. Wife present.     Discussed the use of AI scribe software for clinical note transcription with the patient, who gave verbal consent to proceed.  History of Present Illness Calvin Gray is a 89 year old male with coronary artery disease and chronic kidney disease who presents for a cardiovascular follow-up.  He has coronary artery disease managed with aspirin , with stable symptoms and no recent medication changes.  He has well-controlled hypertension on amlodipine  10 mg daily with blood pressures within target range.  He takes pravastatin  80 mg daily for cholesterol management.  He has stage 3B chronic kidney disease.  He reports reduced strength and balance, especially in the mornings, and uses a walker when unsteady to prevent falls.  He has hearing difficulties and uses hearing aids.  He remains active, continues to drive, and participates in family gatherings.  Cardiac Studies relevent.      Labs   Care everywhere/Faxed External Labs:  07/09/2024:  Serum glucose 115 mg, BUN 32, creatinine 1.85, eGFR 33 mL, potassium 5.0, LFTs normal.  Total cholesterol 119, triglycerides 64, HDL 39, LDL 66.  ROS  Review of Systems  Cardiovascular:  Negative for chest pain, dyspnea on exertion and leg swelling.   Physical Exam:   VS:  BP 110/62   Pulse 68   Ht 5' 10 (1.778 m)   Wt 161 lb 3.2 oz (73.1 kg)   SpO2 94%   BMI 23.13 kg/m    Wt Readings from Last 3 Encounters:  08/08/24 161 lb 3.2 oz (73.1 kg)  05/03/24 158 lb 3.2  oz (71.8 kg)  07/26/23 161 lb (73 kg)    BP Readings from Last 3 Encounters:  08/08/24 110/62  05/03/24 124/63  07/26/23 120/60   Physical Exam Neck:     Vascular: No carotid bruit or JVD.  Cardiovascular:     Rate and Rhythm: Normal rate and regular rhythm.     Pulses: Intact distal pulses.     Heart sounds: Normal heart sounds. No murmur heard.    No gallop.  Pulmonary:     Effort: Pulmonary effort is normal.     Breath sounds: Normal breath sounds.  Abdominal:     General: Bowel sounds are normal.     Palpations: Abdomen is soft.  Musculoskeletal:     Right lower leg: No edema.     Left lower leg: No edema.    EKG:    EKG Interpretation Date/Time:  Wednesday August 08 2024 13:39:36 EST Ventricular Rate:  68 PR Interval:  202 QRS Duration:  112 QT Interval:  370 QTC Calculation: 393 R Axis:   -16  Text Interpretation: Normal sinus rhythm Minimal voltage criteria for LVH, may be normal variant ( R in aVL ) Septal infarct , age undetermined When compared with ECG of 26-Jul-2023 14:37, Septal infarct is now Present T wave inversion no longer evident in Lateral leads Confirmed by Jowan Skillin, Jagadeesh [phone]) on 08/08/2024 1:54:28 PM    ASSESSMENT AND PLAN: .      ICD-10-CM  1. Coronary artery disease involving native coronary artery of native heart without angina pectoris  I25.10 EKG 12-Lead    2. Primary hypertension  I10     3. Stage 3b chronic kidney disease (HCC)  N18.32     4. Complex sleep apnea syndrome  G47.39      Assessment & Plan Atherosclerotic heart disease of native coronary artery without angina pectoris EKG and physical examination are normal. No changes in medication regimen over the past two to three years. - Continue pravastatin  80 mg once daily - Continue aspirin  therapy  Essential hypertension Blood pressure is well-controlled with current medication regimen. - Continue amlodipine  10 mg once daily  Chronic kidney disease, stage  3b Chronic kidney disease stage 3b with stable kidney function over the years. - Continue current management and monitoring  OSA compliant with CPAP   Follow up: PRN Signed,  Gordy Bergamo, MD, [facility] 08/08/2024, 2:01 PM [facility] [address] [place], [place] [address] Phone: [phone]. Fax:  [phone]

## 2024-08-08 NOTE — Patient Instructions (Signed)
 Medication Instructions:  Your physician recommends that you continue on your current medications as directed. Please refer to the Current Medication list given to you today.  *If you need a refill on your cardiac medications before your next appointment, please call your pharmacy*  Lab Work: none If you have labs (blood work) drawn today and your tests are completely normal, you will receive your results only by: MyChart Message (if you have MyChart) OR A paper copy in the mail If you have any lab test that is abnormal or we need to change your treatment, we will call you to review the results.  Testing/Procedures: none  Follow-Up: At [facility], you and your health needs are our priority.  As part of our continuing mission to provide you with exceptional heart care, our providers are all part of one team.  This team includes your primary Cardiologist (physician) and Advanced Practice Providers or APPs (Physician Assistants and Nurse Practitioners) who all work together to provide you with the care you need, when you need it.  Your next appointment:   As needed  Provider:   Dr Berry Bristol    We recommend signing up for the patient portal called "MyChart".  Sign up information is provided on this After Visit Summary.  MyChart is used to connect with patients for Virtual Visits (Telemedicine).  Patients are able to view lab/test results, encounter notes, upcoming appointments, etc.  Non-urgent messages can be sent to your provider as well.   To learn more about what you can do with MyChart, go to [email or web address].   Other Instructions

## 2024-09-25 ENCOUNTER — Other Ambulatory Visit: Payer: Self-pay | Admitting: Cardiology

## 2024-09-25 DIAGNOSIS — I10 Essential (primary) hypertension: Secondary | ICD-10-CM
